# Patient Record
Sex: Male | Born: 1950
Health system: Southern US, Community
[De-identification: ages and names within clinical notes are randomized; demographics above are authoritative.]

## PROBLEM LIST (undated history)

## (undated) DIAGNOSIS — E782 Mixed hyperlipidemia: Secondary | ICD-10-CM

## (undated) DIAGNOSIS — I2510 Atherosclerotic heart disease of native coronary artery without angina pectoris: Secondary | ICD-10-CM

## (undated) DIAGNOSIS — I10 Essential (primary) hypertension: Secondary | ICD-10-CM

## (undated) DIAGNOSIS — E119 Type 2 diabetes mellitus without complications: Secondary | ICD-10-CM

## (undated) DIAGNOSIS — K219 Gastro-esophageal reflux disease without esophagitis: Secondary | ICD-10-CM

## (undated) HISTORY — DX: Atherosclerotic heart disease of native coronary artery without angina pectoris: I25.10

## (undated) HISTORY — DX: Essential (primary) hypertension: I10

## (undated) HISTORY — DX: Mixed hyperlipidemia: E78.2

## (undated) HISTORY — DX: Type 2 diabetes mellitus without complications: E11.9

## (undated) HISTORY — DX: Gastro-esophageal reflux disease without esophagitis: K21.9

---

## 1998-10-19 ENCOUNTER — Encounter: Payer: Self-pay | Admitting: *Deleted

## 1998-10-19 ENCOUNTER — Observation Stay (HOSPITAL_COMMUNITY): Admission: RE | Admit: 1998-10-19 | Discharge: 1998-10-20 | Payer: Self-pay | Admitting: Internal Medicine

## 2001-10-24 ENCOUNTER — Ambulatory Visit (HOSPITAL_BASED_OUTPATIENT_CLINIC_OR_DEPARTMENT_OTHER): Admission: RE | Admit: 2001-10-24 | Discharge: 2001-10-24 | Payer: Self-pay | Admitting: Internal Medicine

## 2003-07-26 ENCOUNTER — Encounter: Payer: Self-pay | Admitting: Otolaryngology

## 2003-07-30 ENCOUNTER — Inpatient Hospital Stay (HOSPITAL_COMMUNITY): Admission: RE | Admit: 2003-07-30 | Discharge: 2003-07-31 | Payer: Self-pay | Admitting: Otolaryngology

## 2003-11-12 ENCOUNTER — Ambulatory Visit (HOSPITAL_BASED_OUTPATIENT_CLINIC_OR_DEPARTMENT_OTHER): Admission: RE | Admit: 2003-11-12 | Discharge: 2003-11-12 | Payer: Self-pay | Admitting: Otolaryngology

## 2005-12-31 DIAGNOSIS — I2510 Atherosclerotic heart disease of native coronary artery without angina pectoris: Secondary | ICD-10-CM

## 2005-12-31 HISTORY — DX: Atherosclerotic heart disease of native coronary artery without angina pectoris: I25.10

## 2006-07-12 ENCOUNTER — Ambulatory Visit: Payer: Self-pay | Admitting: Cardiology

## 2006-07-15 ENCOUNTER — Ambulatory Visit: Payer: Self-pay | Admitting: Cardiology

## 2006-07-15 ENCOUNTER — Observation Stay (HOSPITAL_COMMUNITY): Admission: RE | Admit: 2006-07-15 | Discharge: 2006-07-16 | Payer: Self-pay | Admitting: Cardiology

## 2006-07-15 ENCOUNTER — Inpatient Hospital Stay (HOSPITAL_BASED_OUTPATIENT_CLINIC_OR_DEPARTMENT_OTHER): Admission: RE | Admit: 2006-07-15 | Discharge: 2006-07-15 | Payer: Self-pay | Admitting: Cardiology

## 2006-08-09 ENCOUNTER — Ambulatory Visit: Payer: Self-pay | Admitting: Cardiology

## 2006-09-05 ENCOUNTER — Ambulatory Visit: Payer: Self-pay | Admitting: Cardiology

## 2007-01-22 ENCOUNTER — Ambulatory Visit: Payer: Self-pay | Admitting: Cardiology

## 2020-08-30 DIAGNOSIS — I2510 Atherosclerotic heart disease of native coronary artery without angina pectoris: Secondary | ICD-10-CM | POA: Diagnosis not present

## 2020-08-30 DIAGNOSIS — I10 Essential (primary) hypertension: Secondary | ICD-10-CM | POA: Diagnosis not present

## 2020-08-30 DIAGNOSIS — E1169 Type 2 diabetes mellitus with other specified complication: Secondary | ICD-10-CM | POA: Diagnosis not present

## 2020-08-30 DIAGNOSIS — E785 Hyperlipidemia, unspecified: Secondary | ICD-10-CM | POA: Diagnosis not present

## 2020-11-08 DIAGNOSIS — Z1389 Encounter for screening for other disorder: Secondary | ICD-10-CM | POA: Diagnosis not present

## 2020-11-08 DIAGNOSIS — I2510 Atherosclerotic heart disease of native coronary artery without angina pectoris: Secondary | ICD-10-CM | POA: Diagnosis not present

## 2020-11-08 DIAGNOSIS — E559 Vitamin D deficiency, unspecified: Secondary | ICD-10-CM | POA: Diagnosis not present

## 2020-11-08 DIAGNOSIS — I10 Essential (primary) hypertension: Secondary | ICD-10-CM | POA: Diagnosis not present

## 2020-11-08 DIAGNOSIS — E1169 Type 2 diabetes mellitus with other specified complication: Secondary | ICD-10-CM | POA: Diagnosis not present

## 2020-11-08 DIAGNOSIS — K219 Gastro-esophageal reflux disease without esophagitis: Secondary | ICD-10-CM | POA: Diagnosis not present

## 2020-11-08 DIAGNOSIS — E785 Hyperlipidemia, unspecified: Secondary | ICD-10-CM | POA: Diagnosis not present

## 2020-12-20 DIAGNOSIS — Z23 Encounter for immunization: Secondary | ICD-10-CM | POA: Diagnosis not present

## 2020-12-21 ENCOUNTER — Ambulatory Visit (HOSPITAL_COMMUNITY)
Admission: RE | Admit: 2020-12-21 | Discharge: 2020-12-21 | Disposition: A | Discharge status: Home / Self Care | Payer: Medicare Other | Source: Ambulatory Visit | Attending: Internal Medicine | Admitting: Internal Medicine

## 2020-12-21 ENCOUNTER — Other Ambulatory Visit: Payer: Self-pay | Admitting: Internal Medicine

## 2020-12-21 ENCOUNTER — Other Ambulatory Visit: Payer: Self-pay

## 2020-12-21 ENCOUNTER — Other Ambulatory Visit (HOSPITAL_COMMUNITY): Payer: Self-pay | Admitting: Internal Medicine

## 2020-12-21 DIAGNOSIS — I10 Essential (primary) hypertension: Secondary | ICD-10-CM | POA: Diagnosis not present

## 2020-12-21 DIAGNOSIS — M79604 Pain in right leg: Secondary | ICD-10-CM | POA: Diagnosis not present

## 2020-12-21 DIAGNOSIS — E785 Hyperlipidemia, unspecified: Secondary | ICD-10-CM | POA: Diagnosis not present

## 2020-12-21 DIAGNOSIS — E1169 Type 2 diabetes mellitus with other specified complication: Secondary | ICD-10-CM | POA: Diagnosis not present

## 2020-12-21 DIAGNOSIS — I2510 Atherosclerotic heart disease of native coronary artery without angina pectoris: Secondary | ICD-10-CM | POA: Diagnosis not present

## 2020-12-21 DIAGNOSIS — M25561 Pain in right knee: Secondary | ICD-10-CM | POA: Diagnosis not present

## 2020-12-21 DIAGNOSIS — R600 Localized edema: Secondary | ICD-10-CM | POA: Diagnosis not present

## 2021-01-28 DIAGNOSIS — E1169 Type 2 diabetes mellitus with other specified complication: Secondary | ICD-10-CM | POA: Diagnosis not present

## 2021-01-28 DIAGNOSIS — I10 Essential (primary) hypertension: Secondary | ICD-10-CM | POA: Diagnosis not present

## 2021-01-28 DIAGNOSIS — K219 Gastro-esophageal reflux disease without esophagitis: Secondary | ICD-10-CM | POA: Diagnosis not present

## 2021-01-28 DIAGNOSIS — E785 Hyperlipidemia, unspecified: Secondary | ICD-10-CM | POA: Diagnosis not present

## 2021-02-07 DIAGNOSIS — I2510 Atherosclerotic heart disease of native coronary artery without angina pectoris: Secondary | ICD-10-CM | POA: Diagnosis not present

## 2021-02-07 DIAGNOSIS — K219 Gastro-esophageal reflux disease without esophagitis: Secondary | ICD-10-CM | POA: Diagnosis not present

## 2021-02-07 DIAGNOSIS — E1169 Type 2 diabetes mellitus with other specified complication: Secondary | ICD-10-CM | POA: Diagnosis not present

## 2021-02-07 DIAGNOSIS — I10 Essential (primary) hypertension: Secondary | ICD-10-CM | POA: Diagnosis not present

## 2021-02-07 DIAGNOSIS — E785 Hyperlipidemia, unspecified: Secondary | ICD-10-CM | POA: Diagnosis not present

## 2021-02-09 DIAGNOSIS — L820 Inflamed seborrheic keratosis: Secondary | ICD-10-CM | POA: Diagnosis not present

## 2021-02-09 DIAGNOSIS — Z1283 Encounter for screening for malignant neoplasm of skin: Secondary | ICD-10-CM | POA: Diagnosis not present

## 2021-02-09 DIAGNOSIS — D225 Melanocytic nevi of trunk: Secondary | ICD-10-CM | POA: Diagnosis not present

## 2021-02-27 DIAGNOSIS — E1169 Type 2 diabetes mellitus with other specified complication: Secondary | ICD-10-CM | POA: Diagnosis not present

## 2021-02-27 DIAGNOSIS — K219 Gastro-esophageal reflux disease without esophagitis: Secondary | ICD-10-CM | POA: Diagnosis not present

## 2021-02-27 DIAGNOSIS — I2510 Atherosclerotic heart disease of native coronary artery without angina pectoris: Secondary | ICD-10-CM | POA: Diagnosis not present

## 2021-02-27 DIAGNOSIS — I10 Essential (primary) hypertension: Secondary | ICD-10-CM | POA: Diagnosis not present

## 2021-02-27 DIAGNOSIS — E785 Hyperlipidemia, unspecified: Secondary | ICD-10-CM | POA: Diagnosis not present

## 2021-03-01 DIAGNOSIS — E785 Hyperlipidemia, unspecified: Secondary | ICD-10-CM | POA: Diagnosis not present

## 2021-03-01 DIAGNOSIS — K219 Gastro-esophageal reflux disease without esophagitis: Secondary | ICD-10-CM | POA: Diagnosis not present

## 2021-03-28 DIAGNOSIS — M25569 Pain in unspecified knee: Secondary | ICD-10-CM | POA: Diagnosis not present

## 2021-03-29 DIAGNOSIS — I2510 Atherosclerotic heart disease of native coronary artery without angina pectoris: Secondary | ICD-10-CM | POA: Diagnosis not present

## 2021-03-29 DIAGNOSIS — K219 Gastro-esophageal reflux disease without esophagitis: Secondary | ICD-10-CM | POA: Diagnosis not present

## 2021-03-29 DIAGNOSIS — I10 Essential (primary) hypertension: Secondary | ICD-10-CM | POA: Diagnosis not present

## 2021-03-29 DIAGNOSIS — E785 Hyperlipidemia, unspecified: Secondary | ICD-10-CM | POA: Diagnosis not present

## 2021-03-29 DIAGNOSIS — E1169 Type 2 diabetes mellitus with other specified complication: Secondary | ICD-10-CM | POA: Diagnosis not present

## 2021-04-04 DIAGNOSIS — M25569 Pain in unspecified knee: Secondary | ICD-10-CM | POA: Diagnosis not present

## 2021-04-12 DIAGNOSIS — S83241A Other tear of medial meniscus, current injury, right knee, initial encounter: Secondary | ICD-10-CM | POA: Diagnosis not present

## 2021-04-12 DIAGNOSIS — M1711 Unilateral primary osteoarthritis, right knee: Secondary | ICD-10-CM | POA: Diagnosis not present

## 2021-04-12 DIAGNOSIS — M25561 Pain in right knee: Secondary | ICD-10-CM | POA: Diagnosis not present

## 2021-04-18 DIAGNOSIS — S83241D Other tear of medial meniscus, current injury, right knee, subsequent encounter: Secondary | ICD-10-CM | POA: Diagnosis not present

## 2021-04-19 DIAGNOSIS — K219 Gastro-esophageal reflux disease without esophagitis: Secondary | ICD-10-CM | POA: Diagnosis not present

## 2021-04-19 DIAGNOSIS — S83209A Unspecified tear of unspecified meniscus, current injury, unspecified knee, initial encounter: Secondary | ICD-10-CM | POA: Diagnosis not present

## 2021-04-19 DIAGNOSIS — E785 Hyperlipidemia, unspecified: Secondary | ICD-10-CM | POA: Diagnosis not present

## 2021-04-19 DIAGNOSIS — E1169 Type 2 diabetes mellitus with other specified complication: Secondary | ICD-10-CM | POA: Diagnosis not present

## 2021-04-19 DIAGNOSIS — R739 Hyperglycemia, unspecified: Secondary | ICD-10-CM | POA: Diagnosis not present

## 2021-04-19 DIAGNOSIS — Z7689 Persons encountering health services in other specified circumstances: Secondary | ICD-10-CM | POA: Diagnosis not present

## 2021-04-19 DIAGNOSIS — I10 Essential (primary) hypertension: Secondary | ICD-10-CM | POA: Diagnosis not present

## 2021-04-19 DIAGNOSIS — I2510 Atherosclerotic heart disease of native coronary artery without angina pectoris: Secondary | ICD-10-CM | POA: Diagnosis not present

## 2021-04-29 DIAGNOSIS — K219 Gastro-esophageal reflux disease without esophagitis: Secondary | ICD-10-CM | POA: Diagnosis not present

## 2021-04-29 DIAGNOSIS — E1169 Type 2 diabetes mellitus with other specified complication: Secondary | ICD-10-CM | POA: Diagnosis not present

## 2021-04-29 DIAGNOSIS — I10 Essential (primary) hypertension: Secondary | ICD-10-CM | POA: Diagnosis not present

## 2021-04-29 DIAGNOSIS — E785 Hyperlipidemia, unspecified: Secondary | ICD-10-CM | POA: Diagnosis not present

## 2021-04-29 DIAGNOSIS — I2510 Atherosclerotic heart disease of native coronary artery without angina pectoris: Secondary | ICD-10-CM | POA: Diagnosis not present

## 2021-05-09 DIAGNOSIS — I2510 Atherosclerotic heart disease of native coronary artery without angina pectoris: Secondary | ICD-10-CM | POA: Diagnosis not present

## 2021-05-09 DIAGNOSIS — E1169 Type 2 diabetes mellitus with other specified complication: Secondary | ICD-10-CM | POA: Diagnosis not present

## 2021-05-09 DIAGNOSIS — K219 Gastro-esophageal reflux disease without esophagitis: Secondary | ICD-10-CM | POA: Diagnosis not present

## 2021-05-09 DIAGNOSIS — S83209A Unspecified tear of unspecified meniscus, current injury, unspecified knee, initial encounter: Secondary | ICD-10-CM | POA: Diagnosis not present

## 2021-05-09 DIAGNOSIS — E785 Hyperlipidemia, unspecified: Secondary | ICD-10-CM | POA: Diagnosis not present

## 2021-05-09 DIAGNOSIS — I10 Essential (primary) hypertension: Secondary | ICD-10-CM | POA: Diagnosis not present

## 2021-05-17 ENCOUNTER — Ambulatory Visit: Payer: Medicare Other | Admitting: Cardiology

## 2021-06-01 DIAGNOSIS — H5213 Myopia, bilateral: Secondary | ICD-10-CM | POA: Diagnosis not present

## 2021-06-01 DIAGNOSIS — H43393 Other vitreous opacities, bilateral: Secondary | ICD-10-CM | POA: Diagnosis not present

## 2021-06-06 ENCOUNTER — Encounter: Payer: Self-pay | Admitting: *Deleted

## 2021-06-06 ENCOUNTER — Encounter: Payer: Self-pay | Admitting: Cardiology

## 2021-06-06 NOTE — Progress Notes (Signed)
Cardiology Office Note  Date: 06/07/2021   ID: Ryan Mckee, DOB 1950/09/05, MRN [id number]  PCP:  Helena Nation, MD  Cardiologist:  Rozann Lesches, MD Electrophysiologist:  None   Chief Complaint  Patient presents with  . Preoperative evaluation    History of Present Illness: Ryan Mckee is a 71 y.o. male referred for cardiology consultation by Dr. Jimmye Norman to establish cardiology follow-up and for preoperative evaluation.  He is being considered for arthroscopic right knee surgery with Dr. Noemi Chapel due to torn meniscus.  I could not actually pull his old records, but he did have stent cards today to review.  He underwent placement of BMS to the mid RCA and DES to the mid LAD by Dr. Olevia Perches back in 2007.  Interval cardiology follow-up with Dr. Hamilton Capri with [facility] noted in 2015, but no regular cardiology follow-up since that time.  He deals an antique toys, goes to several shows, does a lot of lifting and packing with this and reports no angina or breathlessness beyond NYHA class II.  Main limitation is right knee pain at this point.  No palpitations or syncope.  He describes activities meeting or exceeding 4 METS.  I reviewed his recent ECG and lab work as noted below.  RCRI cardiac risk calculator indicates class II, 0.9% chance of major adverse cardiac event.   Past Medical History:  Diagnosis Date  . CAD (coronary artery disease) 2007   BMS to mid RCA and DES to mid LAD 2007  . Essential hypertension   . GERD (gastroesophageal reflux disease)   . Mixed hyperlipidemia   . Type 2 diabetes mellitus ([place])     Past Surgical History:  Procedure Laterality Date  . GALLBLADDER SURGERY    . Renal stent intervention    . TONSILLECTOMY      Current Outpatient Medications  Medication Sig Dispense Refill  . Alogliptin Benzoate 25 MG TABS Take 25 mg by mouth daily.    Marland Kitchen amLODipine (NORVASC) 10 MG tablet Take 10 mg by mouth daily.    Marland Kitchen aspirin 325 MG tablet Take 325 mg by  mouth daily.    . Cholecalciferol (VITAMIN D) 50 MCG (2000 UT) tablet Take 2,000 Units by mouth daily.    Marland Kitchen glipiZIDE (GLUCOTROL) 10 MG tablet Take 10 mg by mouth 2 (two) times daily before a meal.    . lisinopril (ZESTRIL) 20 MG tablet Take 20 mg by mouth 2 (two) times daily.    . metoprolol tartrate (LOPRESSOR) 50 MG tablet Take 50 mg by mouth 2 (two) times daily.    Marland Kitchen omeprazole (PRILOSEC) 20 MG capsule Take 40 mg by mouth daily.    . rosuvastatin (CRESTOR) 20 MG tablet Take 20 mg by mouth daily.    Marland Kitchen spironolactone (ALDACTONE) 25 MG tablet Take 25 mg by mouth 2 (two) times daily.     No current facility-administered medications for this visit.   Allergies:  Sulfa antibiotics   Social History: The patient  reports that he quit smoking about 20 years ago. His smoking use included cigarettes. He has never used smokeless tobacco. He reports current alcohol use. He reports that he does not use drugs.   Family History: The patient's family history includes Heart attack (age of onset: 3) in his father; Hypertension in his mother.   ROS: No falls.  No orthopnea or PND.  Physical Exam: VS:  BP (!) 142/84   Pulse 60   Ht 5' 9.5" (1.765 m)  Wt 208 lb 6.4 oz (94.5 kg)   SpO2 96%   BMI 30.33 kg/m , BMI Body mass index is 30.33 kg/m.  Wt Readings from Last 3 Encounters:  06/07/21 208 lb 6.4 oz (94.5 kg)    General: Patient appears comfortable at rest. HEENT: Conjunctiva and lids normal, bearded, wearing a mask. Neck: Supple, no elevated JVP or carotid bruits, no thyromegaly. Lungs: Clear to auscultation, nonlabored breathing at rest. Cardiac: Regular rate and rhythm, no S3 or significant systolic murmur, no pericardial rub. Abdomen: Soft, nontender, bowel sounds present. Extremities: No pitting edema, distal pulses 2+. Skin: Warm and dry. Musculoskeletal: No kyphosis. Neuropsychiatric: Alert and oriented x3, affect grossly appropriate.  ECG:  An ECG dated April 2022 was personally  reviewed today and demonstrated:  Normal sinus rhythm with decreased R wave progression, rule out old anterior infarct pattern.  Recent Labwork:  April 2022: Hemoglobin A1c 6.6%, BUN 18, creatinine 1.07, potassium 5.2, AST 22, ALT 32, hemoglobin 15.8, platelets 269  Other Studies Reviewed Today:  No prior cardiac studies for review today.  Assessment and Plan:  1.  Preoperative cardiac evaluation in a 71 year old male with history of CAD as noted above, hypertension, hyperlipidemia, and type 2 diabetes mellitus.  He is not on insulin, renal function normal, no prior history of stroke.  RCRI cardiac risk calculator indicates class II, 0.9% chance of major adverse cardiac event.  He does not report any angina and require further ischemic testing at this time and should be able to proceed as planned with arthroscopic right knee surgery.  2.  CAD status post BMS to the mid RCA and DES to the mid LAD in 2007.  He reports no active angina.  Continue aspirin, Norvasc, lisinopril, Lopressor, and Crestor.  3.  Essential hypertension, following with Dr. Jimmye Norman at [place].  Currently on Aldactone, Lopressor, lisinopril, and Norvasc.  I reviewed his recent lab work.  No changes were made today.  Medication Adjustments/Labs and Tests Ordered: Current medicines are reviewed at length with the patient today.  Concerns regarding medicines are outlined above.   Tests Ordered: No orders of the defined types were placed in this encounter.   Medication Changes: No orders of the defined types were placed in this encounter.   Disposition:  Follow up 6 months.  Signed, Satira Sark, MD, [facility] 06/07/2021 9:30 AM    [facility] [place], [place], [place] [address] Phone: [phone]; Fax: [phone]

## 2021-06-07 ENCOUNTER — Encounter: Payer: Self-pay | Admitting: Cardiology

## 2021-06-07 ENCOUNTER — Ambulatory Visit: Payer: Medicare Other | Admitting: Cardiology

## 2021-06-07 VITALS — BP 142/84 | HR 60 | Ht 69.5 in | Wt 208.4 lb

## 2021-06-07 DIAGNOSIS — I25119 Atherosclerotic heart disease of native coronary artery with unspecified angina pectoris: Secondary | ICD-10-CM | POA: Diagnosis not present

## 2021-06-07 DIAGNOSIS — Z01810 Encounter for preprocedural cardiovascular examination: Secondary | ICD-10-CM

## 2021-06-07 DIAGNOSIS — I10 Essential (primary) hypertension: Secondary | ICD-10-CM | POA: Diagnosis not present

## 2021-06-07 NOTE — Patient Instructions (Addendum)

## 2021-06-08 ENCOUNTER — Encounter: Payer: Self-pay | Admitting: *Deleted

## 2021-06-08 NOTE — Telephone Encounter (Signed)
This encounter was created in error - please disregard.

## 2021-06-21 DIAGNOSIS — M94261 Chondromalacia, right knee: Secondary | ICD-10-CM | POA: Diagnosis not present

## 2021-06-21 DIAGNOSIS — S83281A Other tear of lateral meniscus, current injury, right knee, initial encounter: Secondary | ICD-10-CM | POA: Diagnosis not present

## 2021-06-21 DIAGNOSIS — S83241A Other tear of medial meniscus, current injury, right knee, initial encounter: Secondary | ICD-10-CM | POA: Diagnosis not present

## 2021-06-21 DIAGNOSIS — S83231A Complex tear of medial meniscus, current injury, right knee, initial encounter: Secondary | ICD-10-CM | POA: Diagnosis not present

## 2021-06-21 DIAGNOSIS — G8918 Other acute postprocedural pain: Secondary | ICD-10-CM | POA: Diagnosis not present

## 2021-06-21 DIAGNOSIS — M2341 Loose body in knee, right knee: Secondary | ICD-10-CM | POA: Diagnosis not present

## 2021-07-10 DIAGNOSIS — E1169 Type 2 diabetes mellitus with other specified complication: Secondary | ICD-10-CM | POA: Diagnosis not present

## 2021-07-10 DIAGNOSIS — Z0001 Encounter for general adult medical examination with abnormal findings: Secondary | ICD-10-CM | POA: Diagnosis not present

## 2021-07-20 DIAGNOSIS — M2341 Loose body in knee, right knee: Secondary | ICD-10-CM | POA: Diagnosis not present

## 2021-07-30 DIAGNOSIS — K219 Gastro-esophageal reflux disease without esophagitis: Secondary | ICD-10-CM | POA: Diagnosis not present

## 2021-07-30 DIAGNOSIS — E1169 Type 2 diabetes mellitus with other specified complication: Secondary | ICD-10-CM | POA: Diagnosis not present

## 2021-07-30 DIAGNOSIS — I2510 Atherosclerotic heart disease of native coronary artery without angina pectoris: Secondary | ICD-10-CM | POA: Diagnosis not present

## 2021-07-30 DIAGNOSIS — E785 Hyperlipidemia, unspecified: Secondary | ICD-10-CM | POA: Diagnosis not present

## 2021-07-30 DIAGNOSIS — I10 Essential (primary) hypertension: Secondary | ICD-10-CM | POA: Diagnosis not present

## 2021-08-15 DIAGNOSIS — H40053 Ocular hypertension, bilateral: Secondary | ICD-10-CM | POA: Diagnosis not present

## 2021-08-17 DIAGNOSIS — K219 Gastro-esophageal reflux disease without esophagitis: Secondary | ICD-10-CM | POA: Diagnosis not present

## 2021-08-17 DIAGNOSIS — E785 Hyperlipidemia, unspecified: Secondary | ICD-10-CM | POA: Diagnosis not present

## 2021-08-17 DIAGNOSIS — S83209A Unspecified tear of unspecified meniscus, current injury, unspecified knee, initial encounter: Secondary | ICD-10-CM | POA: Diagnosis not present

## 2021-08-17 DIAGNOSIS — I10 Essential (primary) hypertension: Secondary | ICD-10-CM | POA: Diagnosis not present

## 2021-08-17 DIAGNOSIS — E1169 Type 2 diabetes mellitus with other specified complication: Secondary | ICD-10-CM | POA: Diagnosis not present

## 2021-08-17 DIAGNOSIS — I2510 Atherosclerotic heart disease of native coronary artery without angina pectoris: Secondary | ICD-10-CM | POA: Diagnosis not present

## 2021-08-30 DIAGNOSIS — I2510 Atherosclerotic heart disease of native coronary artery without angina pectoris: Secondary | ICD-10-CM | POA: Diagnosis not present

## 2021-08-30 DIAGNOSIS — I10 Essential (primary) hypertension: Secondary | ICD-10-CM | POA: Diagnosis not present

## 2021-08-30 DIAGNOSIS — E785 Hyperlipidemia, unspecified: Secondary | ICD-10-CM | POA: Diagnosis not present

## 2021-08-30 DIAGNOSIS — K219 Gastro-esophageal reflux disease without esophagitis: Secondary | ICD-10-CM | POA: Diagnosis not present

## 2021-08-30 DIAGNOSIS — E1169 Type 2 diabetes mellitus with other specified complication: Secondary | ICD-10-CM | POA: Diagnosis not present

## 2021-09-25 DIAGNOSIS — H5310 Unspecified subjective visual disturbances: Secondary | ICD-10-CM | POA: Diagnosis not present

## 2021-09-25 DIAGNOSIS — H43812 Vitreous degeneration, left eye: Secondary | ICD-10-CM | POA: Diagnosis not present

## 2021-09-25 DIAGNOSIS — H40051 Ocular hypertension, right eye: Secondary | ICD-10-CM | POA: Diagnosis not present

## 2021-09-25 DIAGNOSIS — H2513 Age-related nuclear cataract, bilateral: Secondary | ICD-10-CM | POA: Diagnosis not present

## 2021-09-25 DIAGNOSIS — H43393 Other vitreous opacities, bilateral: Secondary | ICD-10-CM | POA: Diagnosis not present

## 2021-09-28 DIAGNOSIS — M1611 Unilateral primary osteoarthritis, right hip: Secondary | ICD-10-CM | POA: Diagnosis not present

## 2021-09-29 DIAGNOSIS — E785 Hyperlipidemia, unspecified: Secondary | ICD-10-CM | POA: Diagnosis not present

## 2021-09-29 DIAGNOSIS — E1169 Type 2 diabetes mellitus with other specified complication: Secondary | ICD-10-CM | POA: Diagnosis not present

## 2021-09-29 DIAGNOSIS — I10 Essential (primary) hypertension: Secondary | ICD-10-CM | POA: Diagnosis not present

## 2021-09-29 DIAGNOSIS — K219 Gastro-esophageal reflux disease without esophagitis: Secondary | ICD-10-CM | POA: Diagnosis not present

## 2021-09-29 DIAGNOSIS — I2510 Atherosclerotic heart disease of native coronary artery without angina pectoris: Secondary | ICD-10-CM | POA: Diagnosis not present

## 2021-10-07 IMAGING — US US EXTREM LOW VENOUS*R*
1 series · 14 of 24 positions shown · non-contrast
Comparison: None.

CLINICAL DATA: Acute right lower extremity pain for 1 month, edema

EXAM:
RIGHT LOWER EXTREMITY VENOUS DOPPLER ULTRASOUND
TECHNIQUE: Gray-scale sonography with compression, as well as color and duplex
ultrasound, were performed to evaluate the deep venous system(s)
from the level of the common femoral vein through the popliteal and
proximal calf veins.

[Series 1: us venous img lower uni right (dvt) · portal-venous · 14 of 36 slices shown]
[im 1/36]
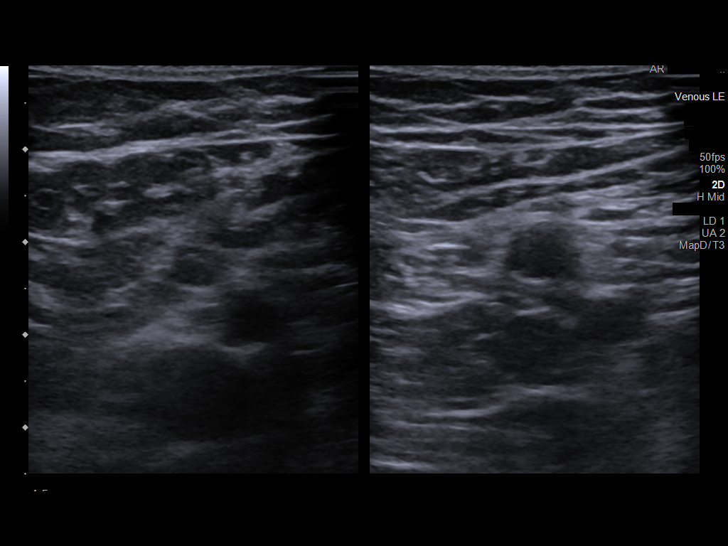
[im 4/36]
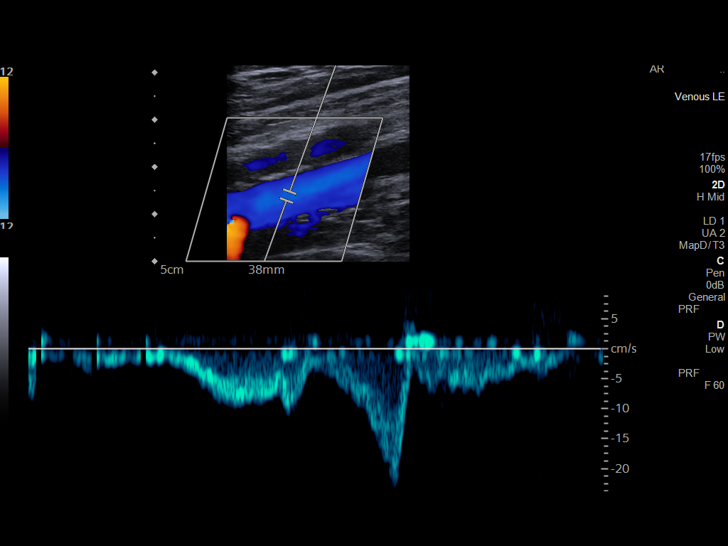
[im 7/36]
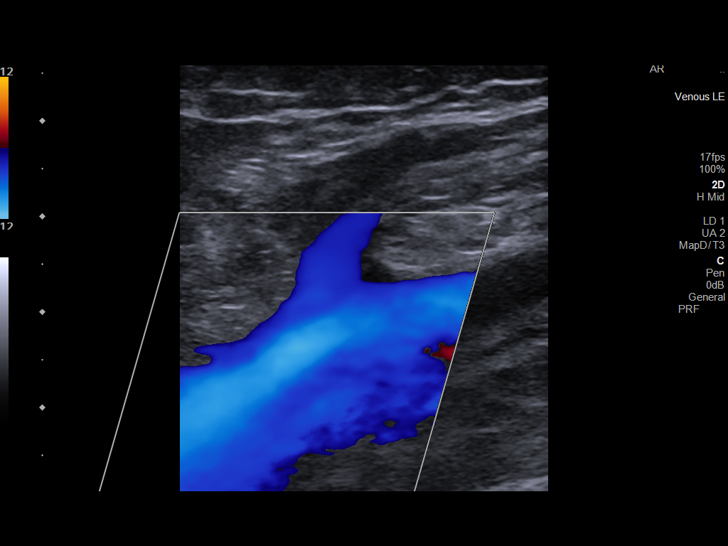
[im 10/36]
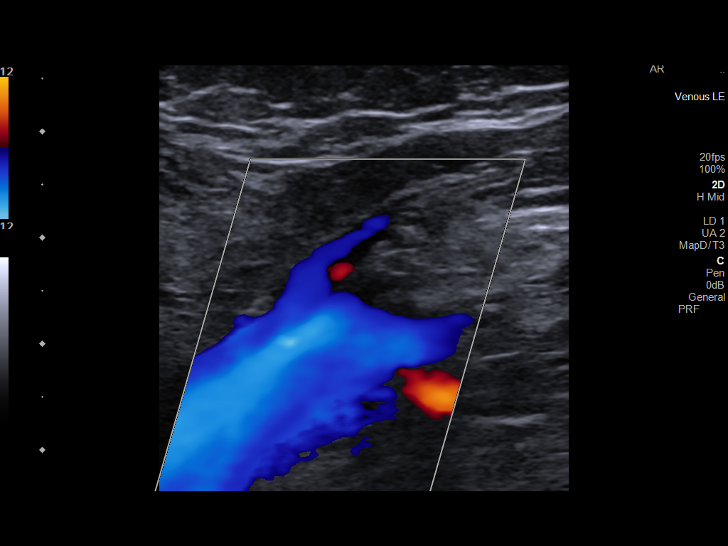
[im 11/36]
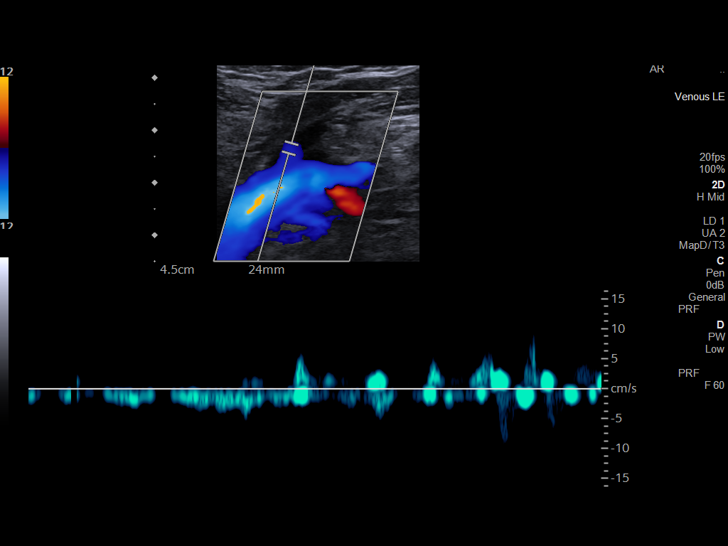
[im 14/36]
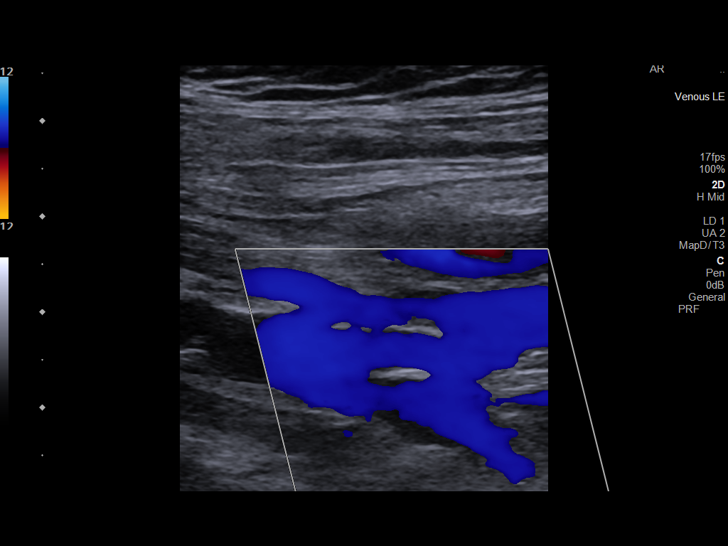
[im 17/36]
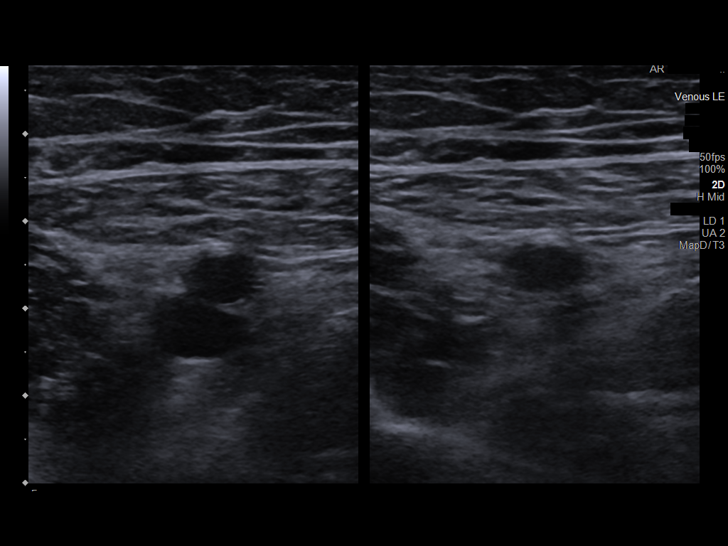
[im 19/36]
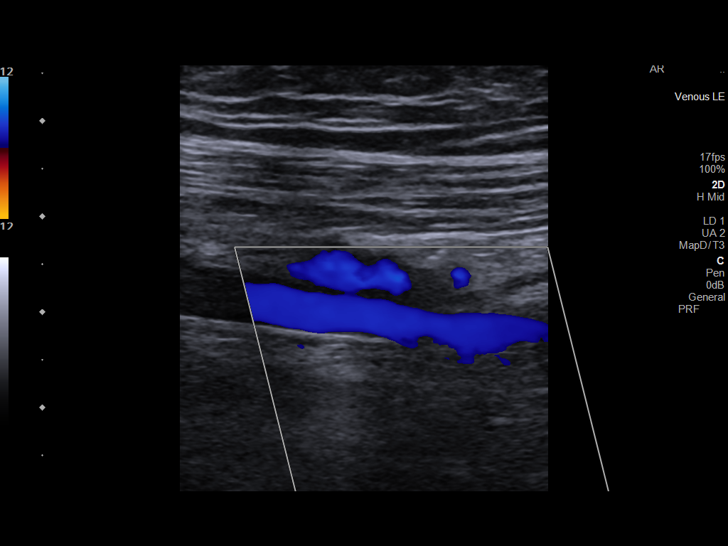
[im 22/36]
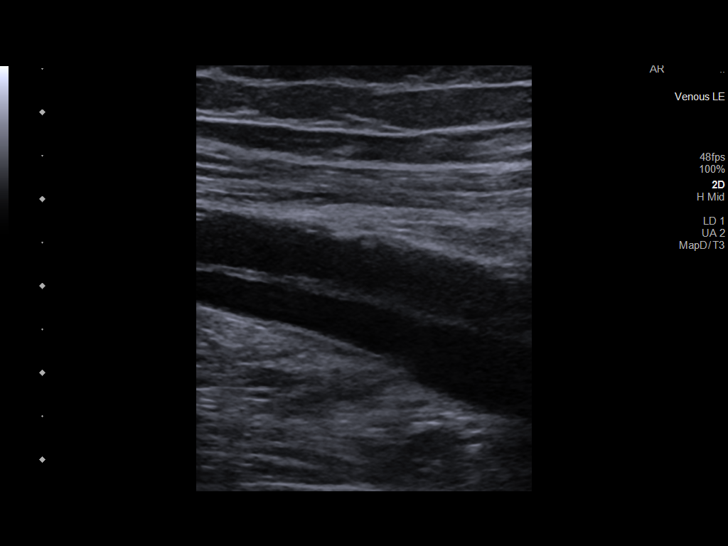
[im 25/36]
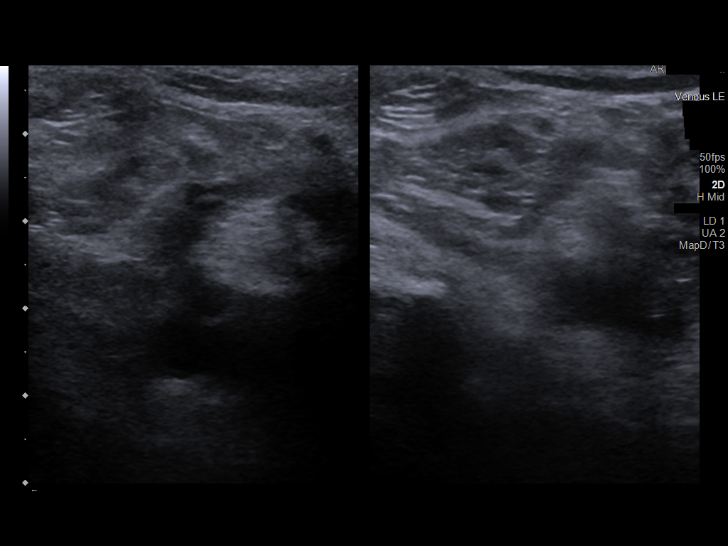
[im 28/36]
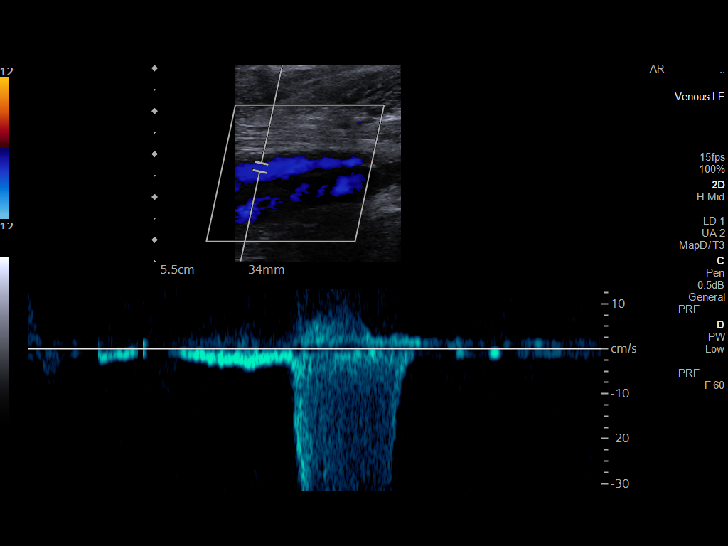
[im 29/36]
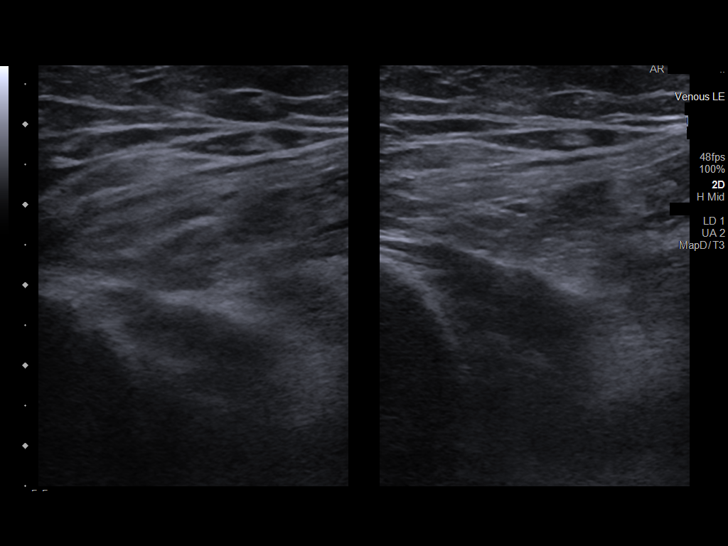
[im 32/36]
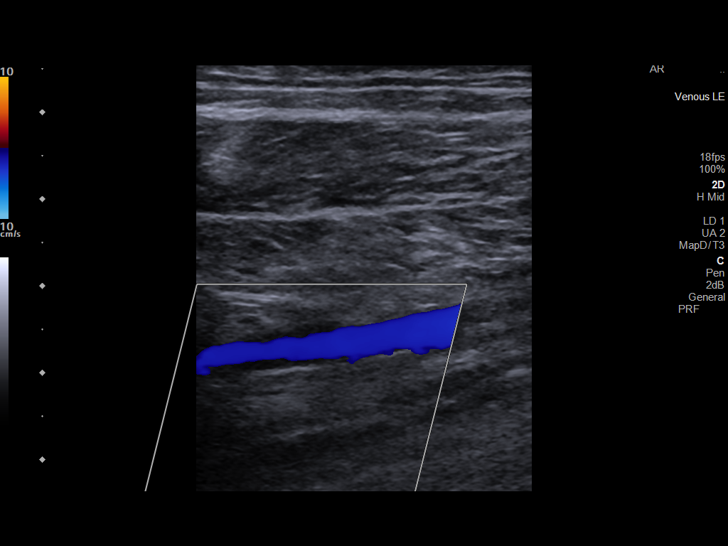
[im 36/36]
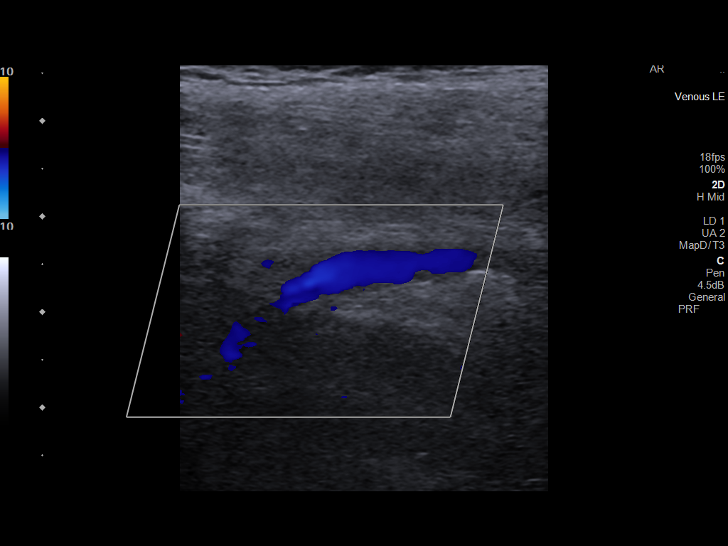

[14 of 24 positions shown; findings below may reference images not displayed]

FINDINGS: VENOUS

Normal compressibility of the common femoral, superficial femoral,
and popliteal veins, as well as the visualized calf veins.
Visualized portions of profunda femoral vein and great saphenous
vein unremarkable. No filling defects to suggest DVT on grayscale or
color Doppler imaging. Doppler waveforms show normal direction of
venous flow, normal respiratory plasticity and response to
augmentation.

Limited views of the contralateral common femoral vein are
unremarkable.

OTHER

None.

Limitations: none
IMPRESSION: Negative.

## 2021-11-08 DIAGNOSIS — M1611 Unilateral primary osteoarthritis, right hip: Secondary | ICD-10-CM | POA: Diagnosis not present

## 2021-11-13 DIAGNOSIS — I2510 Atherosclerotic heart disease of native coronary artery without angina pectoris: Secondary | ICD-10-CM | POA: Diagnosis not present

## 2021-11-13 DIAGNOSIS — Z23 Encounter for immunization: Secondary | ICD-10-CM | POA: Diagnosis not present

## 2021-11-13 DIAGNOSIS — I10 Essential (primary) hypertension: Secondary | ICD-10-CM | POA: Diagnosis not present

## 2021-11-13 DIAGNOSIS — E785 Hyperlipidemia, unspecified: Secondary | ICD-10-CM | POA: Diagnosis not present

## 2021-11-13 DIAGNOSIS — S83209A Unspecified tear of unspecified meniscus, current injury, unspecified knee, initial encounter: Secondary | ICD-10-CM | POA: Diagnosis not present

## 2021-11-13 DIAGNOSIS — K219 Gastro-esophageal reflux disease without esophagitis: Secondary | ICD-10-CM | POA: Diagnosis not present

## 2021-11-13 DIAGNOSIS — E1169 Type 2 diabetes mellitus with other specified complication: Secondary | ICD-10-CM | POA: Diagnosis not present

## 2021-12-07 ENCOUNTER — Ambulatory Visit: Payer: Medicare Other | Admitting: Cardiology

## 2022-02-13 DIAGNOSIS — I10 Essential (primary) hypertension: Secondary | ICD-10-CM | POA: Diagnosis not present

## 2022-02-13 DIAGNOSIS — H9190 Unspecified hearing loss, unspecified ear: Secondary | ICD-10-CM | POA: Diagnosis not present

## 2022-02-13 DIAGNOSIS — E785 Hyperlipidemia, unspecified: Secondary | ICD-10-CM | POA: Diagnosis not present

## 2022-02-13 DIAGNOSIS — H6120 Impacted cerumen, unspecified ear: Secondary | ICD-10-CM | POA: Diagnosis not present

## 2022-02-13 DIAGNOSIS — H6123 Impacted cerumen, bilateral: Secondary | ICD-10-CM | POA: Diagnosis not present

## 2022-02-13 DIAGNOSIS — S83209A Unspecified tear of unspecified meniscus, current injury, unspecified knee, initial encounter: Secondary | ICD-10-CM | POA: Diagnosis not present

## 2022-02-13 DIAGNOSIS — E1169 Type 2 diabetes mellitus with other specified complication: Secondary | ICD-10-CM | POA: Diagnosis not present

## 2022-02-13 DIAGNOSIS — K219 Gastro-esophageal reflux disease without esophagitis: Secondary | ICD-10-CM | POA: Diagnosis not present

## 2022-02-13 DIAGNOSIS — E669 Obesity, unspecified: Secondary | ICD-10-CM | POA: Diagnosis not present

## 2022-02-13 DIAGNOSIS — I2510 Atherosclerotic heart disease of native coronary artery without angina pectoris: Secondary | ICD-10-CM | POA: Diagnosis not present

## 2022-02-13 DIAGNOSIS — Z6831 Body mass index (BMI) 31.0-31.9, adult: Secondary | ICD-10-CM | POA: Diagnosis not present

## 2022-03-01 DIAGNOSIS — I2510 Atherosclerotic heart disease of native coronary artery without angina pectoris: Secondary | ICD-10-CM | POA: Diagnosis not present

## 2022-03-01 DIAGNOSIS — E1169 Type 2 diabetes mellitus with other specified complication: Secondary | ICD-10-CM | POA: Diagnosis not present

## 2022-03-01 DIAGNOSIS — E785 Hyperlipidemia, unspecified: Secondary | ICD-10-CM | POA: Diagnosis not present

## 2022-03-01 DIAGNOSIS — S83209A Unspecified tear of unspecified meniscus, current injury, unspecified knee, initial encounter: Secondary | ICD-10-CM | POA: Diagnosis not present

## 2022-03-01 DIAGNOSIS — H9190 Unspecified hearing loss, unspecified ear: Secondary | ICD-10-CM | POA: Diagnosis not present

## 2022-03-01 DIAGNOSIS — K219 Gastro-esophageal reflux disease without esophagitis: Secondary | ICD-10-CM | POA: Diagnosis not present

## 2022-03-01 DIAGNOSIS — I10 Essential (primary) hypertension: Secondary | ICD-10-CM | POA: Diagnosis not present

## 2022-03-01 DIAGNOSIS — Z6832 Body mass index (BMI) 32.0-32.9, adult: Secondary | ICD-10-CM | POA: Diagnosis not present

## 2022-03-19 DIAGNOSIS — M1611 Unilateral primary osteoarthritis, right hip: Secondary | ICD-10-CM | POA: Diagnosis not present

## 2022-04-30 DIAGNOSIS — H8112 Benign paroxysmal vertigo, left ear: Secondary | ICD-10-CM | POA: Diagnosis not present

## 2022-04-30 DIAGNOSIS — Z87891 Personal history of nicotine dependence: Secondary | ICD-10-CM | POA: Diagnosis not present

## 2022-04-30 DIAGNOSIS — Z9889 Other specified postprocedural states: Secondary | ICD-10-CM | POA: Diagnosis not present

## 2022-04-30 DIAGNOSIS — H6123 Impacted cerumen, bilateral: Secondary | ICD-10-CM | POA: Diagnosis not present

## 2022-04-30 DIAGNOSIS — Z9089 Acquired absence of other organs: Secondary | ICD-10-CM | POA: Diagnosis not present

## 2022-05-01 DIAGNOSIS — H8113 Benign paroxysmal vertigo, bilateral: Secondary | ICD-10-CM | POA: Diagnosis not present

## 2022-05-01 DIAGNOSIS — H903 Sensorineural hearing loss, bilateral: Secondary | ICD-10-CM | POA: Diagnosis not present

## 2022-07-09 DIAGNOSIS — E1169 Type 2 diabetes mellitus with other specified complication: Secondary | ICD-10-CM | POA: Diagnosis not present

## 2022-07-09 DIAGNOSIS — K219 Gastro-esophageal reflux disease without esophagitis: Secondary | ICD-10-CM | POA: Diagnosis not present

## 2022-07-09 DIAGNOSIS — E785 Hyperlipidemia, unspecified: Secondary | ICD-10-CM | POA: Diagnosis not present

## 2022-07-13 DIAGNOSIS — K219 Gastro-esophageal reflux disease without esophagitis: Secondary | ICD-10-CM | POA: Diagnosis not present

## 2022-07-13 DIAGNOSIS — Z6832 Body mass index (BMI) 32.0-32.9, adult: Secondary | ICD-10-CM | POA: Diagnosis not present

## 2022-07-13 DIAGNOSIS — H9190 Unspecified hearing loss, unspecified ear: Secondary | ICD-10-CM | POA: Diagnosis not present

## 2022-07-13 DIAGNOSIS — E785 Hyperlipidemia, unspecified: Secondary | ICD-10-CM | POA: Diagnosis not present

## 2022-07-13 DIAGNOSIS — E1169 Type 2 diabetes mellitus with other specified complication: Secondary | ICD-10-CM | POA: Diagnosis not present

## 2022-07-13 DIAGNOSIS — E669 Obesity, unspecified: Secondary | ICD-10-CM | POA: Diagnosis not present

## 2022-07-13 DIAGNOSIS — L0390 Cellulitis, unspecified: Secondary | ICD-10-CM | POA: Diagnosis not present

## 2022-07-13 DIAGNOSIS — I10 Essential (primary) hypertension: Secondary | ICD-10-CM | POA: Diagnosis not present

## 2022-07-13 DIAGNOSIS — I2510 Atherosclerotic heart disease of native coronary artery without angina pectoris: Secondary | ICD-10-CM | POA: Diagnosis not present

## 2022-07-13 DIAGNOSIS — S83209A Unspecified tear of unspecified meniscus, current injury, unspecified knee, initial encounter: Secondary | ICD-10-CM | POA: Diagnosis not present

## 2022-07-13 DIAGNOSIS — Z0001 Encounter for general adult medical examination with abnormal findings: Secondary | ICD-10-CM | POA: Diagnosis not present

## 2022-07-30 DIAGNOSIS — I10 Essential (primary) hypertension: Secondary | ICD-10-CM | POA: Diagnosis not present

## 2022-07-30 DIAGNOSIS — E1122 Type 2 diabetes mellitus with diabetic chronic kidney disease: Secondary | ICD-10-CM | POA: Diagnosis not present

## 2022-08-21 DIAGNOSIS — S83209A Unspecified tear of unspecified meniscus, current injury, unspecified knee, initial encounter: Secondary | ICD-10-CM | POA: Diagnosis not present

## 2022-08-21 DIAGNOSIS — I10 Essential (primary) hypertension: Secondary | ICD-10-CM | POA: Diagnosis not present

## 2022-08-21 DIAGNOSIS — E785 Hyperlipidemia, unspecified: Secondary | ICD-10-CM | POA: Diagnosis not present

## 2022-08-21 DIAGNOSIS — E1169 Type 2 diabetes mellitus with other specified complication: Secondary | ICD-10-CM | POA: Diagnosis not present

## 2022-08-21 DIAGNOSIS — I2510 Atherosclerotic heart disease of native coronary artery without angina pectoris: Secondary | ICD-10-CM | POA: Diagnosis not present

## 2022-08-21 DIAGNOSIS — H9190 Unspecified hearing loss, unspecified ear: Secondary | ICD-10-CM | POA: Diagnosis not present

## 2022-08-21 DIAGNOSIS — K219 Gastro-esophageal reflux disease without esophagitis: Secondary | ICD-10-CM | POA: Diagnosis not present

## 2022-08-21 DIAGNOSIS — E1122 Type 2 diabetes mellitus with diabetic chronic kidney disease: Secondary | ICD-10-CM | POA: Diagnosis not present

## 2022-08-21 DIAGNOSIS — E669 Obesity, unspecified: Secondary | ICD-10-CM | POA: Diagnosis not present

## 2022-11-27 DIAGNOSIS — E7849 Other hyperlipidemia: Secondary | ICD-10-CM | POA: Diagnosis not present

## 2022-11-27 DIAGNOSIS — K219 Gastro-esophageal reflux disease without esophagitis: Secondary | ICD-10-CM | POA: Diagnosis not present

## 2022-11-27 DIAGNOSIS — I10 Essential (primary) hypertension: Secondary | ICD-10-CM | POA: Diagnosis not present

## 2022-11-27 DIAGNOSIS — E1122 Type 2 diabetes mellitus with diabetic chronic kidney disease: Secondary | ICD-10-CM | POA: Diagnosis not present

## 2022-11-29 DIAGNOSIS — K219 Gastro-esophageal reflux disease without esophagitis: Secondary | ICD-10-CM | POA: Diagnosis not present

## 2022-11-29 DIAGNOSIS — E782 Mixed hyperlipidemia: Secondary | ICD-10-CM | POA: Diagnosis not present

## 2022-11-29 DIAGNOSIS — I10 Essential (primary) hypertension: Secondary | ICD-10-CM | POA: Diagnosis not present

## 2022-11-29 DIAGNOSIS — E1169 Type 2 diabetes mellitus with other specified complication: Secondary | ICD-10-CM | POA: Diagnosis not present

## 2022-12-03 DIAGNOSIS — I10 Essential (primary) hypertension: Secondary | ICD-10-CM | POA: Diagnosis not present

## 2022-12-03 DIAGNOSIS — K219 Gastro-esophageal reflux disease without esophagitis: Secondary | ICD-10-CM | POA: Diagnosis not present

## 2022-12-03 DIAGNOSIS — I2510 Atherosclerotic heart disease of native coronary artery without angina pectoris: Secondary | ICD-10-CM | POA: Diagnosis not present

## 2022-12-03 DIAGNOSIS — M25569 Pain in unspecified knee: Secondary | ICD-10-CM | POA: Diagnosis not present

## 2022-12-03 DIAGNOSIS — E7849 Other hyperlipidemia: Secondary | ICD-10-CM | POA: Diagnosis not present

## 2022-12-03 DIAGNOSIS — S83209A Unspecified tear of unspecified meniscus, current injury, unspecified knee, initial encounter: Secondary | ICD-10-CM | POA: Diagnosis not present

## 2022-12-03 DIAGNOSIS — E1122 Type 2 diabetes mellitus with diabetic chronic kidney disease: Secondary | ICD-10-CM | POA: Diagnosis not present

## 2022-12-03 DIAGNOSIS — E785 Hyperlipidemia, unspecified: Secondary | ICD-10-CM | POA: Diagnosis not present

## 2022-12-03 DIAGNOSIS — E1169 Type 2 diabetes mellitus with other specified complication: Secondary | ICD-10-CM | POA: Diagnosis not present

## 2022-12-06 DIAGNOSIS — M25561 Pain in right knee: Secondary | ICD-10-CM | POA: Diagnosis not present

## 2023-03-19 ENCOUNTER — Encounter: Payer: Self-pay | Admitting: Nurse Practitioner

## 2023-03-19 ENCOUNTER — Ambulatory Visit: Payer: Medicare HMO | Attending: Nurse Practitioner | Admitting: Nurse Practitioner

## 2023-03-19 VITALS — BP 108/64 | HR 66 | Ht 69.0 in | Wt 217.6 lb

## 2023-03-19 DIAGNOSIS — I10 Essential (primary) hypertension: Secondary | ICD-10-CM | POA: Diagnosis not present

## 2023-03-19 DIAGNOSIS — Z01810 Encounter for preprocedural cardiovascular examination: Secondary | ICD-10-CM | POA: Diagnosis not present

## 2023-03-19 DIAGNOSIS — E782 Mixed hyperlipidemia: Secondary | ICD-10-CM | POA: Diagnosis not present

## 2023-03-19 DIAGNOSIS — I2510 Atherosclerotic heart disease of native coronary artery without angina pectoris: Secondary | ICD-10-CM | POA: Diagnosis not present

## 2023-03-19 NOTE — Progress Notes (Addendum)
Office Visit    Patient Name: Ryan Mckee Date of Encounter: 03/19/2023  PCP:  [place] Nation, [place]  Cardiologist:  Rozann Lesches, MD  Advanced Practice Provider:  Finis Bud, NP Electrophysiologist:  None  [id number]}  Chief Complaint    Ryan Mckee is a 73 y.o. male with a hx of CAD, s/p BMS to [facility] and DES to [place] in 2007, HTN, mixed HLD, T2DM, and GERD presents today for pre-operative cardiovascular risk assessment.  Past Medical History    Past Medical History:  Diagnosis Date   CAD (coronary artery disease) 2007   BMS to mid RCA and DES to mid LAD 2007   Essential hypertension    GERD (gastroesophageal reflux disease)    Mixed hyperlipidemia    Type 2 diabetes mellitus (H[facility])    Past Surgical History:  Procedure Laterality Date   GALLBLADDER SURGERY     Renal stent intervention     TONSILLECTOMY      Allergies  Allergies  Allergen Reactions   Sulfa Antibiotics Hives and Rash    History of Present Illness    Ryan Mckee is a very delightful 73 y.o. male with a PMH as mentioned above.   Last seen by Dr. Domenic Polite on June 07, 2021. Was very active at the time and doing well from a cardiac perspective. Was pending right knee surgery d/t torn meniscus.   Today he presents for pre-op evaluation. He is pending total knee arthroplasty by Dr. Pennie Rushing of [facility]. Doing well from a cardiac perspective. Denies any chest pain, shortness of breath, palpitations, syncope, presyncope, dizziness, orthopnea, PND, swelling or significant weight changes, acute bleeding, or claudication.  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 Enjoys vacationing in [place], [place]. Next month, says he is planning on attending a viewing of [employer] [place] [employer] with his wife.    EKGs/Labs/Other Studies Reviewed:   The following studies were reviewed today:   EKG:  EKG is ordered today.  The ekg ordered  today demonstrates SR, 68 bpm, no acute ischemic changes.   Recent Labs: No results found for requested labs within last 365 days.  Recent Lipid Panel No results found for: "CHOL", "TRIG", "HDL", "CHOLHDL", "VLDL", "LDLCALC", "LDLDIRECT"  Home Medications   Current Meds  Medication Sig   acetaminophen (TYLENOL) 650 MG CR tablet Take 650 mg by mouth every 8 (eight) hours as needed for pain.   Alogliptin Benzoate 25 MG TABS Take 25 mg by mouth daily.   amLODipine (NORVASC) 10 MG tablet Take 10 mg by mouth daily.   aspirin 325 MG tablet Take 325 mg by mouth daily.   Cholecalciferol (VITAMIN D) 50 MCG (2000 UT) tablet Take 2,000 Units by mouth daily.   glipiZIDE (GLUCOTROL) 10 MG tablet Take 10 mg by mouth 2 (two) times daily before a meal.   ibuprofen (ADVIL) 200 MG tablet Take 200 mg by mouth every 6 (six) hours as needed.   lisinopril (ZESTRIL) 20 MG tablet Take 20 mg by mouth 2 (two) times daily.   metoprolol tartrate (LOPRESSOR) 50 MG tablet Take 50 mg by mouth 2 (two) times daily.   omeprazole (PRILOSEC) 20 MG capsule Take 40 mg by mouth daily.   rosuvastatin (CRESTOR) 20 MG tablet Take 10 mg by mouth daily.   spironolactone (ALDACTONE) 25 MG tablet Take 25 mg by mouth 2 (two) times daily.  Review of Systems    All other systems reviewed and are otherwise negative except as noted above.  Physical Exam    VS:  BP 108/64 (BP Location: Left Arm, Patient Position: Sitting, Cuff Size: Large)   Pulse 66   Ht 5\' 9"  (1.753 m)   Wt 217 lb 9.6 oz (98.7 kg)   SpO2 95%   BMI 32.13 kg/m  , BMI Body mass index is 32.13 kg/m.  Wt Readings from Last 3 Encounters:  03/19/23 217 lb 9.6 oz (98.7 kg)  06/07/21 208 lb 6.4 oz (94.5 kg)     GEN: Well nourished, well developed, in no acute distress. HEENT: normal. Neck: Supple, no JVD, carotid bruits, or masses. Cardiac: S1/S2, RRR, no murmurs, rubs, or gallops. No clubbing, cyanosis, edema.  Radials/PT 2+ and equal bilaterally.   Respiratory:  Respirations regular and unlabored, clear to auscultation bilaterally. MS: No deformity or atrophy. Skin: Warm and dry, no rash. Neuro:  Strength and sensation are intact. Psych: Normal affect.  Assessment & Plan    Pre-operative Cardiovascular Risk Assessment Ryan Mckee perioperative risk of a major cardiac event is 0.4% according to the Revised Cardiac Risk Index (RCRI).  Therefore, he is at low risk for perioperative complications.   His functional capacity is excellent at 6.61 METs according to the Duke Activity Status Index (DASI). Recommendations: According to ACC/AHA guidelines, no further cardiovascular testing needed.  The patient may proceed to surgery at acceptable risk.   Antiplatelet and/or Anticoagulation Recommendations: Okay to hold Aspirin 7 days prior to surgery and resume when felt safe to do so. Will route note to requesting party.    2. CAD, s/p BMS to [facility] and DES to mLAD in 2007 Stable with no anginal symptoms. No indication for ischemic evaluation. Continue current medication regimen. Heart healthy diet and regular cardiovascular exercise encouraged.   3. HTN BP stable. No medication changes at this time. Discussed to monitor BP at home at least 2 hours after medications and sitting for 5-10 minutes. Heart healthy diet and regular cardiovascular exercise encouraged.   4. Mixed HLD Managed by PCP. Continue Crestor. Heart healthy diet and regular cardiovascular exercise encouraged.   Disposition: Follow up in 1 year(s) with Rozann Lesches, MD or APP.  Signed, Finis Bud, NP 03/19/2023, 3:36 PM [place]

## 2023-03-19 NOTE — Patient Instructions (Signed)

## 2023-04-18 DIAGNOSIS — G4733 Obstructive sleep apnea (adult) (pediatric): Secondary | ICD-10-CM | POA: Diagnosis not present

## 2023-04-18 DIAGNOSIS — I10 Essential (primary) hypertension: Secondary | ICD-10-CM | POA: Diagnosis not present

## 2023-04-18 DIAGNOSIS — I2510 Atherosclerotic heart disease of native coronary artery without angina pectoris: Secondary | ICD-10-CM | POA: Diagnosis not present

## 2023-04-18 DIAGNOSIS — M1711 Unilateral primary osteoarthritis, right knee: Secondary | ICD-10-CM | POA: Diagnosis not present

## 2023-05-15 DIAGNOSIS — K219 Gastro-esophageal reflux disease without esophagitis: Secondary | ICD-10-CM | POA: Diagnosis not present

## 2023-05-15 DIAGNOSIS — M1711 Unilateral primary osteoarthritis, right knee: Secondary | ICD-10-CM | POA: Diagnosis not present

## 2023-05-15 DIAGNOSIS — E785 Hyperlipidemia, unspecified: Secondary | ICD-10-CM | POA: Diagnosis not present

## 2023-05-15 DIAGNOSIS — I10 Essential (primary) hypertension: Secondary | ICD-10-CM | POA: Diagnosis not present

## 2023-05-15 DIAGNOSIS — Z7984 Long term (current) use of oral hypoglycemic drugs: Secondary | ICD-10-CM | POA: Diagnosis not present

## 2023-05-15 DIAGNOSIS — M25761 Osteophyte, right knee: Secondary | ICD-10-CM | POA: Diagnosis not present

## 2023-05-15 DIAGNOSIS — E119 Type 2 diabetes mellitus without complications: Secondary | ICD-10-CM | POA: Diagnosis not present

## 2023-05-15 DIAGNOSIS — I2510 Atherosclerotic heart disease of native coronary artery without angina pectoris: Secondary | ICD-10-CM | POA: Diagnosis not present

## 2023-05-15 DIAGNOSIS — G4733 Obstructive sleep apnea (adult) (pediatric): Secondary | ICD-10-CM | POA: Diagnosis not present

## 2023-05-16 DIAGNOSIS — I10 Essential (primary) hypertension: Secondary | ICD-10-CM | POA: Diagnosis not present

## 2023-05-16 DIAGNOSIS — Z7984 Long term (current) use of oral hypoglycemic drugs: Secondary | ICD-10-CM | POA: Diagnosis not present

## 2023-05-16 DIAGNOSIS — G4733 Obstructive sleep apnea (adult) (pediatric): Secondary | ICD-10-CM | POA: Diagnosis not present

## 2023-05-16 DIAGNOSIS — E119 Type 2 diabetes mellitus without complications: Secondary | ICD-10-CM | POA: Diagnosis not present

## 2023-05-16 DIAGNOSIS — I2510 Atherosclerotic heart disease of native coronary artery without angina pectoris: Secondary | ICD-10-CM | POA: Diagnosis not present

## 2023-05-16 DIAGNOSIS — K219 Gastro-esophageal reflux disease without esophagitis: Secondary | ICD-10-CM | POA: Diagnosis not present

## 2023-05-16 DIAGNOSIS — M25761 Osteophyte, right knee: Secondary | ICD-10-CM | POA: Diagnosis not present

## 2023-05-16 DIAGNOSIS — E785 Hyperlipidemia, unspecified: Secondary | ICD-10-CM | POA: Diagnosis not present

## 2023-05-16 DIAGNOSIS — M1711 Unilateral primary osteoarthritis, right knee: Secondary | ICD-10-CM | POA: Diagnosis not present

## 2023-05-17 ENCOUNTER — Encounter: Payer: Self-pay | Admitting: Physical Therapy

## 2023-05-17 ENCOUNTER — Ambulatory Visit: Payer: No Typology Code available for payment source | Attending: Orthopaedic Surgery | Admitting: Physical Therapy

## 2023-05-17 ENCOUNTER — Other Ambulatory Visit: Payer: Self-pay

## 2023-05-17 DIAGNOSIS — M25661 Stiffness of right knee, not elsewhere classified: Secondary | ICD-10-CM | POA: Insufficient documentation

## 2023-05-17 DIAGNOSIS — G8929 Other chronic pain: Secondary | ICD-10-CM | POA: Diagnosis present

## 2023-05-17 DIAGNOSIS — M25561 Pain in right knee: Secondary | ICD-10-CM | POA: Insufficient documentation

## 2023-05-17 DIAGNOSIS — R600 Localized edema: Secondary | ICD-10-CM | POA: Diagnosis present

## 2023-05-17 NOTE — Therapy (Signed)
OUTPATIENT PHYSICAL THERAPY LOWER EXTREMITY EVALUATION   Patient Name: Ryan Mckee MRN: [id number] DOB:August 09, 1950, 73 y.o., male Today's Date: 05/17/2023  END OF SESSION:  PT End of Session - 05/17/23 1018     Visit Number 1    Number of Visits 15    Date for PT Re-Evaluation 07/12/23    Authorization Type FOTO    PT Start Time [id number]    Activity Tolerance Patient tolerated treatment well    Behavior During Therapy [facility] for tasks assessed/performed             Past Medical History:  Diagnosis Date   CAD (coronary artery disease) 2007   BMS to mid RCA and DES to mid LAD 2007   Essential hypertension    GERD (gastroesophageal reflux disease)    Mixed hyperlipidemia    Type 2 diabetes mellitus (HCC)    Past Surgical History:  Procedure Laterality Date   GALLBLADDER SURGERY     Renal stent intervention     TONSILLECTOMY     There are no problems to display for this patient.  REFERRING PROVIDER: Kenna Gilbert MD  REFERRING DIAG: Primary OA of right knee, right total knee replacement.  THERAPY DIAG:  Chronic pain of right knee  Stiffness of right knee, not elsewhere classified  Localized edema  Rationale for Evaluation and Treatment: Rehabilitation  ONSET DATE: 05/15/23 (surgery date).  SUBJECTIVE:   SUBJECTIVE STATEMENT: The patient presents to the clinic s/p right total knee replacement performed on 05/15/23.  He is walking with a FWW, TED hose donned and his Aquacel is intact (can be removed on 05/25/23).  He has been compliant to a HEP.  He states his pain is up a bit from yesterday rated at a 6/10 and described as burning, sore and stiff.    PERTINENT HISTORY: DM. PAIN:  Are you having pain? Yes: NPRS scale: 6/10 Pain location: Right knee. Pain description: As above. Aggravating factors: Movement, bending. Relieving factors: Rest and ice.    PRECAUTIONS: Other: No ultrasound.  WEIGHT BEARING RESTRICTIONS: No  FALLS:  Has patient fallen in last 6  months? No  LIVING ENVIRONMENT: Lives with: lives with their spouse Lives in: House/apartment Stairs: No Has following equipment at home: Dan Humphreys - 2 wheeled  OCCUPATION: Retired.  PLOF: Independent  PATIENT GOALS: Get out of pain.   OBJECTIVE:   PATIENT SURVEYS:  FOTO     EDEMA:  Circumferential: 5 cms.  PALPATION: Mild diffuse anterior right knee pain.  LOWER EXTREMITY ROM: In supine:  -10 degrees to 95 degrees.  LOWER EXTREMITY MMT:  Patient able to perform a right antigravity SLR and SAQ.  GAIT: Safe gait with decreased step and stride length using a FWW.   TODAY'S TREATMENT:                                                                                                                              DATE: LE elevation  and vasopneumatic on low x 15 minutes to patient's right knee.    PATIENT EDUCATION:  Education details: Patient compliant to HEP from hospital. Person educated: Patient Education method: Explanation Education comprehension: verbalized understanding  HOME EXERCISE PROGRAM: See above.  ASSESSMENT:  CLINICAL IMPRESSION: The patient presents to OPPT s/p right total knee replacement performed on 05/15/23.  He is pleased with his progress thus far.  He is complinat to his HEP has TED donned and Aquacel intact.  Today, his motion was -10 to 95 degrees.  He is performing antigravity SAQ's and SLR's.  He has a moderate amount of edema currently. He is walking safely with a FWW.  Patient will benefit from skilled physical therapy intervention to address pain and deficits.  OBJECTIVE IMPAIRMENTS: Abnormal gait, decreased activity tolerance, decreased ROM, increased edema, and pain.   ACTIVITY LIMITATIONS: carrying, lifting, bending, and locomotion level  PARTICIPATION LIMITATIONS: meal prep, cleaning, and laundry  REHAB POTENTIAL: Excellent  CLINICAL DECISION MAKING: Stable/uncomplicated  EVALUATION COMPLEXITY: Low   GOALS:  SHORT TERM  GOALS: Target date: 05/31/23  Ind with an initial HEP. Goal status: INITIAL  2.  Full active right knee extension.  Goal status: INITIAL  3.  Active right knee flexion to 105 degrees.  Goal status: INITIAL  LONG TERM GOALS: Target date: 07/12/23  Ind with an advanced HEP.  Goal status: INITIAL  2.  Active right knee flexion to 120 degrees+ so the patient can perform functional tasks and do so with pain not > 2-3/10.  Goal status: INITIAL  3.  Increase right hip and knee strength to a 5/5 to provide good stability for accomplishment of functional activities.  Goal status: INITIAL  4.  Walk a community distance with pain not > 2-3/10.  Goal status: INITIAL  5.  Perform ADL's with pain not > 3/10.  Goal status: INITIAL  PLAN:  PT FREQUENCY: per [facility] 15 visits approved.  PT DURATION: other: 7-8 weeks.  PLANNED INTERVENTIONS: Therapeutic exercises, Therapeutic activity, Neuromuscular re-education, Gait training, Patient/Family education, Self Care, Electrical stimulation, Cryotherapy, Vasopneumatic device, and Manual therapy  PLAN FOR NEXT SESSION: Nustep.  Progress per TKA protocol.     Jency Schnieders, [place], PT 05/17/2023, 11:21 AM

## 2023-05-21 ENCOUNTER — Encounter: Payer: Self-pay | Admitting: Physical Therapy

## 2023-05-21 ENCOUNTER — Ambulatory Visit: Payer: No Typology Code available for payment source | Admitting: Physical Therapy

## 2023-05-21 DIAGNOSIS — R600 Localized edema: Secondary | ICD-10-CM

## 2023-05-21 DIAGNOSIS — M25561 Pain in right knee: Secondary | ICD-10-CM | POA: Diagnosis not present

## 2023-05-21 DIAGNOSIS — M25661 Stiffness of right knee, not elsewhere classified: Secondary | ICD-10-CM

## 2023-05-21 DIAGNOSIS — G8929 Other chronic pain: Secondary | ICD-10-CM

## 2023-05-21 NOTE — Therapy (Signed)
OUTPATIENT PHYSICAL THERAPY LOWER EXTREMITY TREATMENT   Patient Name: Ryan Mckee MRN: [id number] DOB:[birth year], 73 y.o., male Today's Date: 05/21/2023  END OF SESSION:  PT End of Session - 05/21/23 1354     Visit Number 2    Number of Visits 15    Date for PT Re-Evaluation 07/12/23    Authorization Type FOTO    PT Start Time 1349    PT Stop Time 1434    PT Time Calculation (min) 45 min    Activity Tolerance Patient tolerated treatment well    Behavior During Therapy [facility] for tasks assessed/performed            Past Medical History:  Diagnosis Date   CAD (coronary artery disease) 2007   BMS to mid RCA and DES to mid LAD 2007   Essential hypertension    GERD (gastroesophageal reflux disease)    Mixed hyperlipidemia    Type 2 diabetes mellitus (HCC)    Past Surgical History:  Procedure Laterality Date   GALLBLADDER SURGERY     Renal stent intervention     TONSILLECTOMY     There are no problems to display for this patient.  REFERRING PROVIDER: Kenna Gilbert MD  REFERRING DIAG: Primary OA of right knee, right total knee replacement.  THERAPY DIAG:  Chronic pain of right knee  Stiffness of right knee, not elsewhere classified  Localized edema  Rationale for Evaluation and Treatment: Rehabilitation  ONSET DATE: 05/15/23 (surgery date).  SUBJECTIVE:   SUBJECTIVE STATEMENT: Reports more pain over the weekend. Staying up on med schedule as far as he knows.  PERTINENT HISTORY: DM.  PAIN:  Are you having pain? Yes: NPRS scale: 7/10 Pain location: Right knee. Pain description: As above. Aggravating factors: Movement, bending. Relieving factors: Rest and ice.    PRECAUTIONS: Other: No ultrasound.  WEIGHT BEARING RESTRICTIONS: No  FALLS:  Has patient fallen in last 6 months? No  LIVING ENVIRONMENT: Lives with: lives with their spouse Lives in: House/apartment Stairs: No Has following equipment at home: Dan Humphreys - 2 wheeled  OCCUPATION:  Retired.  PLOF: Independent  PATIENT GOALS: Get out of pain.  OBJECTIVE:   PATIENT SURVEYS:  FOTO 36  EDEMA:  Circumferential: 5 cms.  PALPATION: Mild diffuse anterior right knee pain.  LOWER EXTREMITY ROM: In supine:  -10 degrees to 95 degrees.  LOWER EXTREMITY MMT: Patient able to perform a right antigravity SLR and SAQ.  GAIT: Safe gait with decreased step and stride length using a FWW.  TODAY'S TREATMENT:                                                                                                                              DATE: 05/21/2023   EXERCISE LOG  Exercise Repetitions and Resistance Comments  Nustep L3, seat 11 x15 min   Rockerboard X2 min   Lunges 6" step x15 reps Limited by lightheaded sensation and nausea  SAQ X15 reps  Heel prop for ext X3 min    Blank cell = exercise not performed today   Modalities  Date: 05/21/23 Vaso: Knee, Med, 15 mins, Pain and Edema  PATIENT EDUCATION:  Education details: Patient compliant to HEP from hospital. Person educated: Patient Education method: Explanation Education comprehension: verbalized understanding  HOME EXERCISE PROGRAM: See above.  ASSESSMENT:  CLINICAL IMPRESSION: Patient presented in clinic with reports of increased R knee pain currently but especially over the weekend. Patient observed with slow transfers with lethargy and pain. Patient guided through light ROM exercises but stopped standing exercises due to reports of lightheadedness and nausea. Patient was returned to sitting position and provided a cool, damp towel to cool himself. Patient stated that after resting in sitting, the symptoms subsided. Patient stated that he had been experiencing those symptoms more today. Patient reported feeling relief of pain with heel prop into extension. Normal vasopneumatic response noted following removal of the modality. R knee flexion noted in stance phase of gait.  OBJECTIVE IMPAIRMENTS: Abnormal gait,  decreased activity tolerance, decreased ROM, increased edema, and pain.   ACTIVITY LIMITATIONS: carrying, lifting, bending, and locomotion level  PARTICIPATION LIMITATIONS: meal prep, cleaning, and laundry  REHAB POTENTIAL: Excellent  CLINICAL DECISION MAKING: Stable/uncomplicated  EVALUATION COMPLEXITY: Low  GOALS:  SHORT TERM GOALS: Target date: 05/31/23  Ind with an initial HEP. Goal status: On-going  2.  Full active right knee extension.  Goal status: On-going  3.  Active right knee flexion to 105 degrees.  Goal status: On-going  LONG TERM GOALS: Target date: 07/12/23  Ind with an advanced HEP.  Goal status: On-going  2.  Active right knee flexion to 120 degrees+ so the patient can perform functional tasks and do so with pain not > 2-3/10.  Goal status: On-going  3.  Increase right hip and knee strength to a 5/5 to provide good stability for accomplishment of functional activities.  Goal status: On-going  4.  Walk a community distance with pain not > 2-3/10.  Goal status: On-going  5.  Perform ADL's with pain not > 3/10.  Goal status: On-going  PLAN:  PT FREQUENCY: per [facility] 15 visits approved.  PT DURATION: other: 7-8 weeks.  PLANNED INTERVENTIONS: Therapeutic exercises, Therapeutic activity, Neuromuscular re-education, Gait training, Patient/Family education, Self Care, Electrical stimulation, Cryotherapy, Vasopneumatic device, and Manual therapy  PLAN FOR NEXT SESSION: Nustep.  Progress per TKA protocol.    Marvell Fuller, PTA 05/21/2023, 2:38 PM

## 2023-05-23 ENCOUNTER — Ambulatory Visit: Payer: No Typology Code available for payment source

## 2023-05-23 DIAGNOSIS — G8929 Other chronic pain: Secondary | ICD-10-CM

## 2023-05-23 DIAGNOSIS — M25561 Pain in right knee: Secondary | ICD-10-CM | POA: Diagnosis not present

## 2023-05-23 DIAGNOSIS — R600 Localized edema: Secondary | ICD-10-CM

## 2023-05-23 DIAGNOSIS — M25661 Stiffness of right knee, not elsewhere classified: Secondary | ICD-10-CM

## 2023-05-23 NOTE — Therapy (Signed)
OUTPATIENT PHYSICAL THERAPY LOWER EXTREMITY TREATMENT   Patient Name: Ryan Mckee MRN: [id number] DOB:Mar 05, 1950, 73 y.o., male Today's Date: 05/23/2023  END OF SESSION:  PT End of Session - 05/23/23 1526     Visit Number 3    Number of Visits 15    Date for PT Re-Evaluation 07/12/23    Authorization Type FOTO    PT Start Time 1515    PT Stop Time 1602    PT Time Calculation (min) 47 min    Activity Tolerance Patient tolerated treatment well    Behavior During Therapy [facility] for tasks assessed/performed            Past Medical History:  Diagnosis Date   CAD (coronary artery disease) 2007   BMS to mid RCA and DES to mid LAD 2007   Essential hypertension    GERD (gastroesophageal reflux disease)    Mixed hyperlipidemia    Type 2 diabetes mellitus (HCC)    Past Surgical History:  Procedure Laterality Date   GALLBLADDER SURGERY     Renal stent intervention     TONSILLECTOMY     There are no problems to display for this patient.  REFERRING PROVIDER: Kenna Gilbert MD  REFERRING DIAG: Primary OA of right knee, right total knee replacement.  THERAPY DIAG:  Chronic pain of right knee  Stiffness of right knee, not elsewhere classified  Localized edema  Rationale for Evaluation and Treatment: Rehabilitation  ONSET DATE: 05/15/23 (surgery date).  SUBJECTIVE:   SUBJECTIVE STATEMENT: Pt reports feeling a little better today.    PERTINENT HISTORY: DM.  PAIN:  Are you having pain? Yes: NPRS scale: 6/10 Pain location: Right knee. Pain description: As above. Aggravating factors: Movement, bending. Relieving factors: Rest and ice.    PRECAUTIONS: Other: No ultrasound.  WEIGHT BEARING RESTRICTIONS: No  FALLS:  Has patient fallen in last 6 months? No  LIVING ENVIRONMENT: Lives with: lives with their spouse Lives in: House/apartment Stairs: No Has following equipment at home: Dan Humphreys - 2 wheeled  OCCUPATION: Retired.  PLOF: Independent  PATIENT GOALS:  Get out of pain.  OBJECTIVE:   PATIENT SURVEYS:  FOTO 36  EDEMA:  Circumferential: 5 cms.  PALPATION: Mild diffuse anterior right knee pain.  LOWER EXTREMITY ROM: In supine:  -10 degrees to 95 degrees.  LOWER EXTREMITY MMT: Patient able to perform a right antigravity SLR and SAQ.  GAIT: Safe gait with decreased step and stride length using a FWW.  TODAY'S TREATMENT:                                                                                                                              DATE: 05/23/2023   EXERCISE LOG  Exercise Repetitions and Resistance Comments  Nustep L3, seat 10 x15 min   Rockerboard X2.5 min   Heel/Toe Raises X30 reps each   Lunges 6" step x20  reps Limited by lightheaded sensation and nausea  SAQ X20 reps  Heel prop for ext X3.5 min    Blank cell = exercise not performed today   Modalities  Date: 05/23/23 Vaso: Knee, Med, 15 mins, Pain and Edema  PATIENT EDUCATION:  Education details: Patient compliant to HEP from hospital. Person educated: Patient Education method: Explanation Education comprehension: verbalized understanding  HOME EXERCISE PROGRAM: See above.  ASSESSMENT:  CLINICAL IMPRESSION: Pt arrives for today's treatment session reporting 8/10 right knee pain.  Pt able to tolerate standing exercises today without any side effects or having to stop due to feeling bad.  Pt able to performed increased reps or time with SAQ and static extension stretch without issue.  Normal responses to vaso noted upon removal.  Pt reported 6/10 right knee pain at completion of today's treatment session.   OBJECTIVE IMPAIRMENTS: Abnormal gait, decreased activity tolerance, decreased ROM, increased edema, and pain.   ACTIVITY LIMITATIONS: carrying, lifting, bending, and locomotion level  PARTICIPATION LIMITATIONS: meal prep, cleaning, and laundry  REHAB POTENTIAL: Excellent  CLINICAL DECISION MAKING: Stable/uncomplicated  EVALUATION COMPLEXITY:  Low  GOALS:  SHORT TERM GOALS: Target date: 05/31/23  Ind with an initial HEP. Goal status: On-going  2.  Full active right knee extension.  Goal status: On-going  3.  Active right knee flexion to 105 degrees.  Goal status: On-going  LONG TERM GOALS: Target date: 07/12/23  Ind with an advanced HEP.  Goal status: On-going  2.  Active right knee flexion to 120 degrees+ so the patient can perform functional tasks and do so with pain not > 2-3/10.  Goal status: On-going  3.  Increase right hip and knee strength to a 5/5 to provide good stability for accomplishment of functional activities.  Goal status: On-going  4.  Walk a community distance with pain not > 2-3/10.  Goal status: On-going  5.  Perform ADL's with pain not > 3/10.  Goal status: On-going  PLAN:  PT [facility]REQUENC[facility]: per [facility] visits approved.  PT DURATION: other: 7-8 weeks.  PLANNED INTERVENTIONS: Therapeutic exercises, Therapeutic activity, Neuromuscular re-education, Gait training, Patient/Family education, Self Care, Electrical stimulation, Cryotherapy, Vasopneumatic device, and Manual therapy  PLAN FOR NEXT SESSION: Nustep.  Progress per TKA protocol.    Newman Pies, PTA 05/23/2023, 4:13 PM

## 2023-05-28 ENCOUNTER — Ambulatory Visit: Payer: No Typology Code available for payment source | Admitting: Physical Therapy

## 2023-05-28 ENCOUNTER — Encounter: Payer: Self-pay | Admitting: Physical Therapy

## 2023-05-28 DIAGNOSIS — G8929 Other chronic pain: Secondary | ICD-10-CM

## 2023-05-28 DIAGNOSIS — R600 Localized edema: Secondary | ICD-10-CM

## 2023-05-28 DIAGNOSIS — M25661 Stiffness of right knee, not elsewhere classified: Secondary | ICD-10-CM

## 2023-05-28 DIAGNOSIS — M25561 Pain in right knee: Secondary | ICD-10-CM | POA: Diagnosis not present

## 2023-05-28 NOTE — Therapy (Signed)
OUTPATIENT PHYSICAL THERAPY LOWER EXTREMITY TREATMENT   Patient Name: Ryan Mckee MRN: [id number] DOB:17-Dec-1950, 73 y.o., male Today's Date: 05/28/2023  END OF SESSION:  PT End of Session - 05/28/23 1433     Visit Number 4    Number of Visits 15    Date for PT Re-Evaluation 07/12/23    Authorization Type FOTO    PT Start Time 1431    PT Stop Time 1513    PT Time Calculation (min) 42 min    Activity Tolerance Patient tolerated treatment well    Behavior During Therapy [facility] for tasks assessed/performed            Past Medical History:  Diagnosis Date   CAD (coronary artery disease) 2007   BMS to mid RCA and DES to mid LAD 2007   Essential hypertension    GERD (gastroesophageal reflux disease)    Mixed hyperlipidemia    Type 2 diabetes mellitus (HCC)    Past Surgical History:  Procedure Laterality Date   GALLBLADDER SURGERY     Renal stent intervention     TONSILLECTOMY     There are no problems to display for this patient.  REFERRING PROVIDER: Kenna Gilbert MD  REFERRING DIAG: Primary OA of right knee, right total knee replacement.  THERAPY DIAG:  Chronic pain of right knee  Stiffness of right knee, not elsewhere classified  Localized edema  Rationale for Evaluation and Treatment: Rehabilitation  ONSET DATE: 05/15/23 (surgery date).  SUBJECTIVE:   SUBJECTIVE STATEMENT: Pt reports feeling a little better today. Just came back from [facility] from [place]. Got a good report from [facility].  PERTINENT HISTORY: DM.  PAIN:  Are you having pain? Yes: NPRS scale: 7/10 Pain location: Right knee. Pain description: As above. Aggravating factors: Movement, bending. Relieving factors: Rest and ice.    PRECAUTIONS: Other: No ultrasound.  PATIENT GOALS: Get out of pain.  OBJECTIVE:   PATIENT SURVEYS:  FOTO 36  EDEMA:  Circumferential: R knee 41.5 cm, L knee 36.7 cm.  PALPATION: Mild diffuse anterior right knee pain.  LOWER EXTREMITY ROM:     Active   Right eval Right AROM 05/28/2023  Hip flexion    Hip extension    Hip abduction    Hip adduction    Hip internal rotation    Hip external rotation    Knee flexion 95 95  Knee extension 10 5  Ankle dorsiflexion    Ankle plantarflexion    Ankle inversion    Ankle eversion     (Blank rows = not tested)   LOWER EXTREMITY MMT: Patient able to perform a right antigravity SLR and SAQ.  GAIT: Safe gait with decreased step and stride length using a FWW.  TODAY'S TREATMENT:                                                                                                                              DATE: 05/28/2023  EXERCISE LOG  Exercise Repetitions and Resistance Comments  Nustep L3, seat 10 x15 min   Rockerboard X2 min   Heel raises X20 reps each   Lunges 6" step x20  reps   Retrostepping RLE x20 reps for knee extension   LAQ X20 reps AROM   Heel slides Supine x20 reps    Blank cell = exercise not performed today   PATIENT EDUCATION:  Education details: Patient compliant to HEP from hospital. Person educated: Patient Education method: Explanation Education comprehension: verbalized understanding  HOME EXERCISE PROGRAM: See above.  ASSESSMENT:  CLINICAL IMPRESSION: Patient presented in clinic with increased discomfort but has been riding in the car and going into appointments. Patient progressed to more active strengthening and ROM exercises. Continued edema of the R knee notable and active R knee extension has improved since evaluation. Patient still using FWW at this time for ambulation. Tightness of R HS palpable during ROM and edema assessment. Patient encouraged to continue using ice at home multiple times per day for edema reduction.   OBJECTIVE IMPAIRMENTS: Abnormal gait, decreased activity tolerance, decreased ROM, increased edema, and pain.   ACTIVITY LIMITATIONS: carrying, lifting, bending, and locomotion level  PARTICIPATION LIMITATIONS: meal prep, cleaning, and  laundry  REHAB POTENTIAL: Excellent  CLINICAL DECISION MAKING: Stable/uncomplicated  EVALUATION COMPLEXITY: Low  GOALS:  SHORT TERM GOALS: Target date: 05/31/23  Ind with an initial HEP. Goal status: On-going  2.  Full active right knee extension.  Goal status: On-going  3.  Active right knee flexion to 105 degrees.  Goal status: On-going  LONG TERM GOALS: Target date: 07/12/23  Ind with an advanced HEP.  Goal status: On-going  2.  Active right knee flexion to 120 degrees+ so the patient can perform functional tasks and do so with pain not > 2-3/10.  Goal status: On-going  3.  Increase right hip and knee strength to a 5/5 to provide good stability for accomplishment of functional activities.  Goal status: On-going  4.  Walk a community distance with pain not > 2-3/10.  Goal status: On-going  5.  Perform ADL's with pain not > 3/10.  Goal status: On-going  PLAN:  PT FREQUENCY: per [facility] 15 visits approved.  PT DURATION: other: 7-8 weeks.  PLANNED INTERVENTIONS: Therapeutic exercises, Therapeutic activity, Neuromuscular re-education, Gait training, Patient/Family education, Self Care, Electrical stimulation, Cryotherapy, Vasopneumatic device, and Manual therapy  PLAN FOR NEXT SESSION: Nustep.  Progress per TKA protocol.    Marvell Fuller, PTA 05/28/2023, 3:43 PM

## 2023-05-30 ENCOUNTER — Ambulatory Visit: Payer: No Typology Code available for payment source

## 2023-05-30 DIAGNOSIS — M25561 Pain in right knee: Secondary | ICD-10-CM | POA: Diagnosis not present

## 2023-05-30 DIAGNOSIS — M25661 Stiffness of right knee, not elsewhere classified: Secondary | ICD-10-CM

## 2023-05-30 DIAGNOSIS — R600 Localized edema: Secondary | ICD-10-CM

## 2023-05-30 DIAGNOSIS — G8929 Other chronic pain: Secondary | ICD-10-CM

## 2023-05-30 NOTE — Therapy (Signed)
OUTPATIENT PHYSICAL THERAPY LOWER EXTREMITY TREATMENT   Patient Name: Ryan Mckee MRN: [id number] DOB:Aug 05, 1950, 73 y.o., male Today's Date: 05/30/2023  END OF SESSION:  PT End of Session - 05/30/23 1435     Visit Number 5    Number of Visits 15    Date for PT Re-Evaluation 07/12/23    Authorization Type FOTO    PT Start Time 1430    PT Stop Time 1515    PT Time Calculation (min) 45 min    Activity Tolerance Patient tolerated treatment well    Behavior During Therapy [facility] for tasks assessed/performed            Past Medical History:  Diagnosis Date   CAD (coronary artery disease) 2007   BMS to mid RCA and DES to mid LAD 2007   Essential hypertension    GERD (gastroesophageal reflux disease)    Mixed hyperlipidemia    Type 2 diabetes mellitus (HCC)    Past Surgical History:  Procedure Laterality Date   GALLBLADDER SURGERY     Renal stent intervention     TONSILLECTOMY     There are no problems to display for this patient.  REFERRING PROVIDER: Kenna Gilbert MD  REFERRING DIAG: Primary OA of right knee, right total knee replacement.  THERAPY DIAG:  Chronic pain of right knee  Stiffness of right knee, not elsewhere classified  Localized edema  Rationale for Evaluation and Treatment: Rehabilitation  ONSET DATE: 05/15/23 (surgery date).  SUBJECTIVE:   SUBJECTIVE STATEMENT: Patient reports that his knee is hurting some as he is not taking his pain medication as much. He has noticed that his knee hurts more the day after his appointments. However, he has noticed some improved movement.   PERTINENT HISTORY: DM.  PAIN:  Are you having pain? Yes: NPRS scale: 6/10 Pain location: Right knee. Pain description: As above. Aggravating factors: Movement, bending. Relieving factors: Rest and ice.    PRECAUTIONS: Other: No ultrasound.  PATIENT GOALS: Get out of pain.  OBJECTIVE:   PATIENT SURVEYS:  FOTO 36  EDEMA:  Circumferential: R knee 41.5 cm, L knee  36.7 cm.  PALPATION: Mild diffuse anterior right knee pain.  LOWER EXTREMITY ROM:     Active  Right eval Right AROM 05/28/2023  Hip flexion    Hip extension    Hip abduction    Hip adduction    Hip internal rotation    Hip external rotation    Knee flexion 95 95  Knee extension 10 5  Ankle dorsiflexion    Ankle plantarflexion    Ankle inversion    Ankle eversion     (Blank rows = not tested)   LOWER EXTREMITY MMT: Patient able to perform a right antigravity SLR and SAQ.  GAIT: Safe gait with decreased step and stride length using a FWW.  TODAY'S TREATMENT:  DATE:                                    5/30 EXERCISE LOG  Exercise Repetitions and Resistance Comments  Nustep L3-5 x 15 minutes; seat 10    LAQ 2 minutes   Seated HS isometric  2 minutes w/ 5 second hold    Seated HS stretch  4 x 30 seconds   Lunges onto step  8" step x 2 minutes   Gastroc stretch  2 minutes   Marching on foam  2 minutes    Blank cell = exercise not performed today  Manual Therapy Soft Tissue Mobilization: right quadriceps and hamstring , for improved soft tissue extensibility Joint Mobilizations: tibiofemoral, grade I-III AP (seated) Passive ROM: right knee flexion, to tolerance    05/28/2023   EXERCISE LOG  Exercise Repetitions and Resistance Comments  Nustep L3, seat 10 x15 min   Rockerboard X2 min   Heel raises X20 reps each   Lunges 6" step x20  reps   Retrostepping RLE x20 reps for knee extension   LAQ X20 reps AROM   Heel slides Supine x20 reps    Blank cell = exercise not performed today   PATIENT EDUCATION:  Education details: Patient compliant to HEP from hospital. Person educated: Patient Education method: Explanation Education comprehension: verbalized understanding  HOME EXERCISE PROGRAM: See above.  ASSESSMENT:  CLINICAL  IMPRESSION: Patient was progressed with multiple new interventions for improved knee flexion. He required minimal cueing with a seated hamstring stretch for proper positioning to facilitate improved soft tissue extensibility. Manual therapy focused on soft tissue mobilization to his right hamstring as this was moderately effective at reducing his familiar pain and improving his knee flexion. This was followed by appropriately matched interventions for improved hamstring soft tissue extensibility. He reported feeling "like I worked out" upon the conclusion of treatment. He continues to require skilled physical therapy to address his remaining impairments to return to his prior level of function.   OBJECTIVE IMPAIRMENTS: Abnormal gait, decreased activity tolerance, decreased ROM, increased edema, and pain.   ACTIVITY LIMITATIONS: carrying, lifting, bending, and locomotion level  PARTICIPATION LIMITATIONS: meal prep, cleaning, and laundry  REHAB POTENTIAL: Excellent  CLINICAL DECISION MAKING: Stable/uncomplicated  EVALUATION COMPLEXITY: Low  GOALS:  SHORT TERM GOALS: Target date: 05/31/23  Ind with an initial HEP. Goal status: On-going  2.  Full active right knee extension.  Goal status: On-going  3.  Active right knee flexion to 105 degrees.  Goal status: On-going  LONG TERM GOALS: Target date: 07/12/23  Ind with an advanced HEP.  Goal status: On-going  2.  Active right knee flexion to 120 degrees+ so the patient can perform functional tasks and do so with pain not > 2-3/10.  Goal status: On-going  3.  Increase right hip and knee strength to a 5/5 to provide good stability for accomplishment of functional activities.  Goal status: On-going  4.  Walk a community distance with pain not > 2-3/10.  Goal status: On-going  5.  Perform ADL's with pain not > 3/10.  Goal status: On-going  PLAN:  PT FREQUENCY: per [facility] 15 visits approved.  PT DURATION: other: 7-8 weeks.  PLANNED  INTERVENTIONS: Therapeutic exercises, Therapeutic activity, Neuromuscular re-education, Gait training, Patient/Family education, Self Care, Electrical stimulation, Cryotherapy, Vasopneumatic device, and Manual therapy  PLAN FOR NEXT SESSION: Nustep.  Progress per TKA protocol.  Granville Lewis, PT 05/30/2023, 3:59 PM

## 2023-06-04 ENCOUNTER — Encounter: Payer: Self-pay | Admitting: Physical Therapy

## 2023-06-04 ENCOUNTER — Ambulatory Visit: Payer: No Typology Code available for payment source | Attending: Orthopaedic Surgery | Admitting: Physical Therapy

## 2023-06-04 DIAGNOSIS — G8929 Other chronic pain: Secondary | ICD-10-CM | POA: Insufficient documentation

## 2023-06-04 DIAGNOSIS — M25661 Stiffness of right knee, not elsewhere classified: Secondary | ICD-10-CM | POA: Insufficient documentation

## 2023-06-04 DIAGNOSIS — M25561 Pain in right knee: Secondary | ICD-10-CM | POA: Insufficient documentation

## 2023-06-04 DIAGNOSIS — R600 Localized edema: Secondary | ICD-10-CM | POA: Insufficient documentation

## 2023-06-04 NOTE — Therapy (Signed)
OUTPATIENT PHYSICAL THERAPY LOWER EXTREMITY TREATMENT   Patient Name: Ryan Mckee MRN: [id number] DOB:March 10, 1950, 73 y.o., male Today's Date: 06/04/2023  END OF SESSION:  PT End of Session - 06/04/23 1513     Visit Number 6    Number of Visits 15    Date for PT Re-Evaluation 07/12/23    Authorization Type FOTO    PT Start Time 1516    PT Stop Time 1559    PT Time Calculation (min) 43 min    Activity Tolerance Patient tolerated treatment well    Behavior During Therapy [facility] for tasks assessed/performed            Past Medical History:  Diagnosis Date   CAD (coronary artery disease) 2007   BMS to mid RCA and DES to mid LAD 2007   Essential hypertension    GERD (gastroesophageal reflux disease)    Mixed hyperlipidemia    Type 2 diabetes mellitus (HCC)    Past Surgical History:  Procedure Laterality Date   GALLBLADDER SURGERY     Renal stent intervention     TONSILLECTOMY     There are no problems to display for this patient.  REFERRING PROVIDER: Kenna Gilbert MD  REFERRING DIAG: Primary OA of right knee, right total knee replacement.  THERAPY DIAG:  Chronic pain of right knee  Stiffness of right knee, not elsewhere classified  Localized edema  Rationale for Evaluation and Treatment: Rehabilitation  ONSET DATE: 05/15/23 (surgery date).  SUBJECTIVE:   SUBJECTIVE STATEMENT: Reports that he was wondering whether he could progress to a cane. Was using a cane prior to TKR.  PERTINENT HISTORY: DM.  PAIN:  Are you having pain? Yes: NPRS scale: 3-4/10 Pain location: Right knee. Pain description: As above. Aggravating factors: Movement, bending. Relieving factors: Rest and ice.    PRECAUTIONS: Other: No ultrasound.  PATIENT GOALS: Get out of pain.  OBJECTIVE:   PATIENT SURVEYS:  FOTO 60  EDEMA:  Circumferential: R knee 41.5 cm, L knee 36.4 cm.  PALPATION: Tenderness of R calf  LOWER EXTREMITY ROM:     Active  Right eval Right AROM 05/28/2023  Right AROM 06/04/23  Hip flexion     Hip extension     Hip abduction     Hip adduction     Hip internal rotation     Hip external rotation     Knee flexion 95 95   Knee extension 10 5 5   Ankle dorsiflexion     Ankle plantarflexion     Ankle inversion     Ankle eversion      (Blank rows = not tested)   LOWER EXTREMITY MMT: Patient able to perform a right antigravity SLR and SAQ.  GAIT: Safe gait with decreased step and stride length using a FWW.  TODAY'S TREATMENT:  DATE:   6/4 EXERCISE LOG  Exercise Repetitions and Resistance Comments  Nustep L4, seat 6 x 15 minutes   Rockerboard X3 min   Lunges 8" step x20 reps   Forward step up 6" step x20 reps   Lateral step up 6" step x20 reps   LAQ 3# x20 reps   Heel prop for ext X3 min    Blank cell = exercise not performed today   PATIENT EDUCATION:  Education details: 3 pt gait cycle and importance of continuing ice for swelling Person educated: Patient Education method: Explanation Education comprehension: verbalized understanding  HOME EXERCISE PROGRAM: See above.  ASSESSMENT:  CLINICAL IMPRESSION: Patient presented in clinic with mild L knee pain although reporting initial rounds of Nustep closer seat for greater knee flexion was painful. TED hose donned to RLE. Patient progressed to functional activities such as step ups. Patient demonstrates a mild deficit with quad activation as well as resting knee extension. Mild edema notable surrounding R knee and importance of ice and elevation were instructed to patient. PTA also demonstrated new 3 pt gait pattern with emphasis on heel/toe sequencing and allowing for knee flexion during gait. Patient does have two SPC at home.  OBJECTIVE IMPAIRMENTS: Abnormal gait, decreased activity tolerance, decreased ROM, increased edema, and pain.   ACTIVITY LIMITATIONS:  carrying, lifting, bending, and locomotion level  PARTICIPATION LIMITATIONS: meal prep, cleaning, and laundry  REHAB POTENTIAL: Excellent  CLINICAL DECISION MAKING: Stable/uncomplicated  EVALUATION COMPLEXITY: Low  GOALS:  SHORT TERM GOALS: Target date: 05/31/23  Ind with an initial HEP. Goal status: On-going  2.  Full active right knee extension.  Goal status: On-going  3.  Active right knee flexion to 105 degrees.  Goal status: On-going  LONG TERM GOALS: Target date: 07/12/23  Ind with an advanced HEP.  Goal status: On-going  2.  Active right knee flexion to 120 degrees+ so the patient can perform functional tasks and do so with pain not > 2-3/10.  Goal status: On-going  3.  Increase right hip and knee strength to a 5/5 to provide good stability for accomplishment of functional activities.  Goal status: On-going  4.  Walk a community distance with pain not > 2-3/10.  Goal status: On-going  5.  Perform ADL's with pain not > 3/10.  Goal status: On-going  PLAN:  PT FREQUENCY: per [facility] 15 visits approved.  PT DURATION: other: 7-8 weeks.  PLANNED INTERVENTIONS: Therapeutic exercises, Therapeutic activity, Neuromuscular re-education, Gait training, Patient/Family education, Self Care, Electrical stimulation, Cryotherapy, Vasopneumatic device, and Manual therapy  PLAN FOR NEXT SESSION: Nustep.  Progress per TKA protocol.    Marvell Fuller, PTA 06/04/2023, 4:22 PM

## 2023-06-06 ENCOUNTER — Ambulatory Visit: Payer: No Typology Code available for payment source

## 2023-06-06 DIAGNOSIS — M25661 Stiffness of right knee, not elsewhere classified: Secondary | ICD-10-CM

## 2023-06-06 DIAGNOSIS — M25561 Pain in right knee: Secondary | ICD-10-CM | POA: Diagnosis not present

## 2023-06-06 DIAGNOSIS — R600 Localized edema: Secondary | ICD-10-CM

## 2023-06-06 DIAGNOSIS — G8929 Other chronic pain: Secondary | ICD-10-CM

## 2023-06-06 NOTE — Therapy (Signed)
OUTPATIENT PHYSICAL THERAPY LOWER EXTREMITY TREATMENT   Patient Name: Ryan Mckee MRN: [id number] DOB:[birth year], 73 y.o., male Today's Date: 06/06/2023  END OF SESSION:  PT End of Session - 06/06/23 1644     Visit Number 7    Number of Visits 15    Date for PT Re-Evaluation 07/12/23    Authorization Type FOTO    PT Start Time 1600    PT Stop Time 1643    PT Time Calculation (min) 43 min    Activity Tolerance Patient tolerated treatment well    Behavior During Therapy [facility] for tasks assessed/performed             Past Medical History:  Diagnosis Date   CAD (coronary artery disease) 2007   BMS to mid RCA and DES to mid LAD 2007   Essential hypertension    GERD (gastroesophageal reflux disease)    Mixed hyperlipidemia    Type 2 diabetes mellitus (HCC)    Past Surgical History:  Procedure Laterality Date   GALLBLADDER SURGERY     Renal stent intervention     TONSILLECTOMY     There are no problems to display for this patient.  REFERRING PROVIDER: Kenna Gilbert MD  REFERRING DIAG: Primary OA of right knee, right total knee replacement.  THERAPY DIAG:  Chronic pain of right knee  Stiffness of right knee, not elsewhere classified  Localized edema  Rationale for Evaluation and Treatment: Rehabilitation  ONSET DATE: 05/15/23 (surgery date).  SUBJECTIVE:   SUBJECTIVE STATEMENT: Patient reports that the back of his knee hurts a little when he straightens his knee out.   PERTINENT HISTORY: DM.  PAIN:  Are you having pain? Yes: NPRS scale: 4.5/10 Pain location: Right knee. Pain description: As above. Aggravating factors: Movement, bending. Relieving factors: Rest and ice.    PRECAUTIONS: Other: No ultrasound.  PATIENT GOALS: Get out of pain.  OBJECTIVE:   PATIENT SURVEYS:  FOTO 60  EDEMA:  Circumferential: R knee 41.5 cm, L knee 36.4 cm.  PALPATION: Tenderness of R calf  LOWER EXTREMITY ROM:     Active  Right eval Right AROM 05/28/2023  Right AROM 06/04/23  Hip flexion     Hip extension     Hip abduction     Hip adduction     Hip internal rotation     Hip external rotation     Knee flexion 95 95   Knee extension 10 5 5   Ankle dorsiflexion     Ankle plantarflexion     Ankle inversion     Ankle eversion      (Blank rows = not tested)   LOWER EXTREMITY MMT: Patient able to perform a right antigravity SLR and SAQ.  GAIT: Safe gait with decreased step and stride length using a FWW.  TODAY'S TREATMENT:  DATE:                                   06/06/23 EXERCISE LOG  Exercise Repetitions and Resistance Comments  Nustep  L4 x 15 minutes; seat 9-8   Rocker board  3 minutes   Marching on foam  2 minutes Intermittent UE support  Standing HS stretch  4 x 30 seconds   Standing HS curl 20 reps  RLE only       Blank cell = exercise not performed today  Manual Therapy Soft Tissue Mobilization: right hamstring and hip adductors, for reduced pain and tone Manual Traction: tibiofemoral distraction seated, with PROM        6/4 EXERCISE LOG  Exercise Repetitions and Resistance Comments  Nustep L4, seat 6 x 15 minutes   Rockerboard X3 min   Lunges 8" step x20 reps   Forward step up 6" step x20 reps   Lateral step up 6" step x20 reps   LAQ 3# x20 reps   Heel prop for ext X3 min    Blank cell = exercise not performed today   PATIENT EDUCATION:  Education details: 3 pt gait cycle and importance of continuing ice for swelling Person educated: Patient Education method: Explanation Education comprehension: verbalized understanding  HOME EXERCISE PROGRAM: See above.  ASSESSMENT:  CLINICAL IMPRESSION: Patient was progressed with familiar interventions to facilitate improved knee mobility for improved function with his daily activities. He required minimal cueing marching on foam to reduce his upper  extremity use to facilitate increased demand on his lower extremities. He experienced a mild increase in right knee discomfort with standing hamstring curls, but this was able to be resolved with manual therapy with tibiofemoral distraction with passive range of motion being the most effective. He reported that his knee felt good upon the conclusion of treatment. He continues to require skilled physical therapy to address his remaining impairments to return to his prior level of function.   OBJECTIVE IMPAIRMENTS: Abnormal gait, decreased activity tolerance, decreased ROM, increased edema, and pain.   ACTIVITY LIMITATIONS: carrying, lifting, bending, and locomotion level  PARTICIPATION LIMITATIONS: meal prep, cleaning, and laundry  REHAB POTENTIAL: Excellent  CLINICAL DECISION MAKING: Stable/uncomplicated  EVALUATION COMPLEXITY: Low  GOALS:  SHORT TERM GOALS: Target date: 05/31/23  Ind with an initial HEP. Goal status: On-going  2.  Full active right knee extension.  Goal status: On-going  3.  Active right knee flexion to 105 degrees.  Goal status: On-going  LONG TERM GOALS: Target date: 07/12/23  Ind with an advanced HEP.  Goal status: On-going  2.  Active right knee flexion to 120 degrees+ so the patient can perform functional tasks and do so with pain not > 2-3/10.  Goal status: On-going  3.  Increase right hip and knee strength to a 5/5 to provide good stability for accomplishment of functional activities.  Goal status: On-going  4.  Walk a community distance with pain not > 2-3/10.  Goal status: On-going  5.  Perform ADL's with pain not > 3/10.  Goal status: On-going  PLAN:  PT FREQUENCY: per [facility] 15 visits approved.  PT DURATION: other: 7-8 weeks.  PLANNED INTERVENTIONS: Therapeutic exercises, Therapeutic activity, Neuromuscular re-education, Gait training, Patient/Family education, Self Care, Electrical stimulation, Cryotherapy, Vasopneumatic device, and Manual  therapy  PLAN FOR NEXT SESSION: Nustep.  Progress per TKA protocol.    [place]an[place] Lewis, PT 06/06/2023,  5:11 PM

## 2023-06-11 ENCOUNTER — Ambulatory Visit: Payer: No Typology Code available for payment source | Admitting: Physical Therapy

## 2023-06-11 DIAGNOSIS — G8929 Other chronic pain: Secondary | ICD-10-CM

## 2023-06-11 DIAGNOSIS — M25661 Stiffness of right knee, not elsewhere classified: Secondary | ICD-10-CM

## 2023-06-11 DIAGNOSIS — R600 Localized edema: Secondary | ICD-10-CM

## 2023-06-11 DIAGNOSIS — M25561 Pain in right knee: Secondary | ICD-10-CM | POA: Diagnosis not present

## 2023-06-11 NOTE — Therapy (Addendum)
OUTPATIENT PHYSICAL THERAPY LOWER EXTREMITY TREATMENT   Patient Name: Ryan Mckee MRN: [id number] DOB:1950/09/14, 73 y.o., male Today's Date: 06/11/2023  END OF SESSION:  PT End of Session - 06/11/23 1610     Visit Number 8    Number of Visits 15    Date for PT Re-Evaluation 07/12/23    Authorization Type FOTO    PT Start Time 0358    PT Stop Time 0440    PT Time Calculation (min) 42 min    Activity Tolerance Patient tolerated treatment well    Behavior During Therapy [facility] for tasks assessed/performed             Past Medical History:  Diagnosis Date   CAD (coronary artery disease) 2007   BMS to mid RCA and DES to mid LAD 2007   Essential hypertension    GERD (gastroesophageal reflux disease)    Mixed hyperlipidemia    Type 2 diabetes mellitus (HCC)    Past Surgical History:  Procedure Laterality Date   GALLBLADDER SURGERY     Renal stent intervention     TONSILLECTOMY     There are no problems to display for this patient.  REFERRING PROVIDER: Kenna Gilbert MD  REFERRING DIAG: Primary OA of right knee, right total knee replacement.  THERAPY DIAG:  Chronic pain of right knee  Stiffness of right knee, not elsewhere classified  Localized edema  Rationale for Evaluation and Treatment: Rehabilitation  ONSET DATE: 05/15/23 (surgery date).  SUBJECTIVE:   SUBJECTIVE STATEMENT: Pain at a 6 today.  PERTINENT HISTORY: DM.  PAIN:  Are you having pain? Yes: NPRS scale: 6/10 Pain location: Right knee. Pain description: As above. Aggravating factors: Movement, bending. Relieving factors: Rest and ice.    PRECAUTIONS: Other: No ultrasound.  PATIENT GOALS: Get out of pain.  OBJECTIVE:   PATIENT SURVEYS:  FOTO 60  EDEMA:  Circumferential: R knee 41.5 cm, L knee 36.4 cm.  PALPATION: Tenderness of R calf  LOWER EXTREMITY ROM:     Active  Right eval Right AROM 05/28/2023 Right AROM 06/04/23 Right A/PROM 06/11/23  Hip flexion      Hip extension       Hip abduction      Hip adduction      Hip internal rotation      Hip external rotation      Knee flexion 95 95  115/120  Knee extension 10 5 5    Ankle dorsiflexion      Ankle plantarflexion      Ankle inversion      Ankle eversion       (Blank rows = not tested)   LOWER EXTREMITY MMT: Patient able to perform a right antigravity SLR and SAQ.  GAIT: Safe gait with decreased step and stride length using a FWW.  TODAY'S TREATMENT:  DATE:                                   06/11/23 EXERCISE LOG  Exercise Repetitions and Resistance Comments  Nustep  L3 x 15 minutes; seat 9,8 7   Recumbent bike Level 1 x 5 minutes (seat 7).   Knee ext 10#  3 minutes.   Ham curls 30#  3 minutes            In supine:  LLLDS into right knee flexion and extension (12 minutes total). PATIENT EDUCATION:  Education details:  Person educated:  International aid/development worker:  Education comprehension:   HOME EXERCISE PROGRAM:   ASSESSMENT:  CLINICAL IMPRESSION:      The patient did a great job today.  He progressed to recumbent bike at seat 7 and was able to make forward revolutions.  Added knee extension and ham curl machine performed with excellent technique and without complaint.  Post-stretching his active right knee flexion was 115 degrees and passive to 120 degrees.  OBJECTIVE IMPAIRMENTS: Abnormal gait, decreased activity tolerance, decreased ROM, increased edema, and pain.   ACTIVITY LIMITATIONS: carrying, lifting, bending, and locomotion level  PARTICIPATION LIMITATIONS: meal prep, cleaning, and laundry  REHAB POTENTIAL: Excellent  CLINICAL DECISION MAKING: Stable/uncomplicated  EVALUATION COMPLEXITY: Low  GOALS:  SHORT TERM GOALS: Target date: 05/31/23  Ind with an initial HEP. Goal status: On-going  2.  Full active right knee extension.  Goal status: On-going  3.   Active right knee flexion to 105 degrees.  Goal status: On-going  LONG TERM GOALS: Target date: 07/12/23  Ind with an advanced HEP.  Goal status: On-going  2.  Active right knee flexion to 120 degrees+ so the patient can perform functional tasks and do so with pain not > 2-3/10.  Goal status: On-going  3.  Increase right hip and knee strength to a 5/5 to provide good stability for accomplishment of functional activities.  Goal status: On-going  4.  Walk a community distance with pain not > 2-3/10.  Goal status: On-going  5.  Perform ADL's with pain not > 3/10.  Goal status: On-going  PLAN:  PT FREQUENCY: per [facility] 15 visits approved.  PT DURATION: other: 7-8 weeks.  PLANNED INTERVENTIONS: Therapeutic exercises, Therapeutic activity, Neuromuscular re-education, Gait training, Patient/Family education, Self Care, Electrical stimulation, Cryotherapy, Vasopneumatic device, and Manual therapy  PLAN FOR NEXT SESSION: Progress with recumbent bike.  Hailley Byers, [place], PT 06/11/2023, 5:18 PM

## 2023-06-13 ENCOUNTER — Encounter: Payer: Self-pay | Admitting: Physical Therapy

## 2023-06-13 ENCOUNTER — Ambulatory Visit: Payer: No Typology Code available for payment source | Admitting: Physical Therapy

## 2023-06-13 DIAGNOSIS — G8929 Other chronic pain: Secondary | ICD-10-CM

## 2023-06-13 DIAGNOSIS — M25561 Pain in right knee: Secondary | ICD-10-CM | POA: Diagnosis not present

## 2023-06-13 DIAGNOSIS — M25661 Stiffness of right knee, not elsewhere classified: Secondary | ICD-10-CM

## 2023-06-13 DIAGNOSIS — R600 Localized edema: Secondary | ICD-10-CM

## 2023-06-13 NOTE — Therapy (Signed)
OUTPATIENT PHYSICAL THERAPY LOWER EXTREMITY TREATMENT   Patient Name: Ryan Mckee MRN: [id number] DOB:[birth year], 73 y.o., male Today's Date: 06/13/2023  END OF SESSION:  PT End of Session - 06/13/23 1601     Visit Number 9    Number of Visits 15    Date for PT Re-Evaluation 07/12/23    Authorization Type FOTO    PT Start Time 1600    PT Stop Time 1646    PT Time Calculation (min) 46 min    Activity Tolerance Patient tolerated treatment well    Behavior During Therapy [facility] for tasks assessed/performed            Past Medical History:  Diagnosis Date   CAD (coronary artery disease) 2007   BMS to mid RCA and DES to mid LAD 2007   Essential hypertension    GERD (gastroesophageal reflux disease)    Mixed hyperlipidemia    Type 2 diabetes mellitus (HCC)    Past Surgical History:  Procedure Laterality Date   GALLBLADDER SURGERY     Renal stent intervention     TONSILLECTOMY     There are no problems to display for this patient.  REFERRING PROVIDER: Kenna Gilbert MD  REFERRING DIAG: Primary OA of right knee, right total knee replacement.  THERAPY DIAG:  Chronic pain of right knee  Stiffness of right knee, not elsewhere classified  Localized edema  Rationale for Evaluation and Treatment: Rehabilitation  ONSET DATE: 05/15/23 (surgery date).  SUBJECTIVE:   SUBJECTIVE STATEMENT: Pain began several hours after last PT session with weights machine but went away. Only taking OTC Tylenol.  PERTINENT HISTORY: DM.  PAIN:  Are you having pain? Yes: NPRS scale: 2/10 Pain location: Right knee. Pain description: As above. Aggravating factors: Movement, bending. Relieving factors: Rest and ice.    PRECAUTIONS: Other: No ultrasound.  PATIENT GOALS: Get out of pain.  OBJECTIVE:   PATIENT SURVEYS:  FOTO 60  EDEMA:  Circumferential: R knee 41.5 cm, L knee 36.4 cm.  PALPATION: Tenderness of R calf  LOWER EXTREMITY ROM:     Active  Right eval Right  AROM 05/28/2023 Right AROM 06/04/23 Right A/PROM 06/11/23 R knee AROM 06/13/23  Hip flexion       Hip extension       Hip abduction       Hip adduction       Hip internal rotation       Hip external rotation       Knee flexion 95 95  115/120 118  Knee extension 10 5 5  4   Ankle dorsiflexion       Ankle plantarflexion       Ankle inversion       Ankle eversion        (Blank rows = not tested)   LOWER EXTREMITY MMT: Patient able to perform a right antigravity SLR and SAQ.  GAIT: Mild antalgic deviation/ less RLE WB with SPC  TODAY'S TREATMENT:  DATE:  06/13/23 EXERCISE LOG  Exercise Repetitions and Resistance Comments  Nustep  L3 x 10 minutes; seat 6   Recumbent bike L2, seat 7 x10 min   Rockerboard X2 min   Standing HS stretch 3x30 sec   Lunges 8" box x15 reps   LAQ 5# x20 reps 5 sec holds   Heel prop into ext X5 min   Supine hip flexor stretch 2x 1 min    PATIENT EDUCATION:  Education details:  Person educated:  International aid/development worker:  Education comprehension:   HOME EXERCISE PROGRAM:  ASSESSMENT:  CLINICAL IMPRESSION:    Patient presented in clinic with reports of mild R knee pain. Patient still unable to sleep for long duration in bed which is taller. Patient able to tolerate progression of exercises for ROM well. Machine weights were not completed today secondary to pain from last PT treatment. AROM of R knee improving with mild edema noted. Patient observed with and without SPC ambulating in clinic with only mild antalgic deviation. Patient states that he is holding his cane more than using it for stability at this time. Hip flexor stretching initiated to help with ROM as well as quad tightness reported.  OBJECTIVE IMPAIRMENTS: Abnormal gait, decreased activity tolerance, decreased ROM, increased edema, and pain.   ACTIVITY LIMITATIONS: carrying,  lifting, bending, and locomotion level  PARTICIPATION LIMITATIONS: meal prep, cleaning, and laundry  REHAB POTENTIAL: Excellent  CLINICAL DECISION MAKING: Stable/uncomplicated  EVALUATION COMPLEXITY: Low  GOALS:  SHORT TERM GOALS: Target date: 05/31/23  Ind with an initial HEP. Goal status: On-going  2.  Full active right knee extension.  Goal status: On-going  3.  Active right knee flexion to 105 degrees.  Goal status: MET  LONG TERM GOALS: Target date: 07/12/23  Ind with an advanced HEP.  Goal status: On-going  2.  Active right knee flexion to 120 degrees+ so the patient can perform functional tasks and do so with pain not > 2-3/10.  Goal status: On-going  3.  Increase right hip and knee strength to a 5/5 to provide good stability for accomplishment of functional activities.  Goal status: On-going  4.  Walk a community distance with pain not > 2-3/10.  Goal status: MET  5.  Perform ADL's with pain not > 3/10.  Goal status: MET  PLAN:  PT FREQUENCY: per [facility] 15 visits approved.  PT DURATION: other: 7-8 weeks.  PLANNED INTERVENTIONS: Therapeutic exercises, Therapeutic activity, Neuromuscular re-education, Gait training, Patient/Family education, Self Care, Electrical stimulation, Cryotherapy, Vasopneumatic device, and Manual therapy  PLAN FOR NEXT SESSION: Progress with recumbent bike. FOTO on 10th visit.  Marvell Fuller, PTA 06/13/2023, 5:01 PM

## 2023-06-18 ENCOUNTER — Ambulatory Visit: Payer: No Typology Code available for payment source | Admitting: Physical Therapy

## 2023-06-18 ENCOUNTER — Encounter: Payer: Self-pay | Admitting: Physical Therapy

## 2023-06-18 DIAGNOSIS — R600 Localized edema: Secondary | ICD-10-CM

## 2023-06-18 DIAGNOSIS — G8929 Other chronic pain: Secondary | ICD-10-CM

## 2023-06-18 DIAGNOSIS — M25661 Stiffness of right knee, not elsewhere classified: Secondary | ICD-10-CM

## 2023-06-18 DIAGNOSIS — M25561 Pain in right knee: Secondary | ICD-10-CM | POA: Diagnosis not present

## 2023-06-18 NOTE — Therapy (Signed)
OUTPATIENT PHYSICAL THERAPY LOWER EXTREMITY TREATMENT   Patient Name: Ryan Mckee MRN: [id number] DOB:1950-10-05, 73 y.o., male Today's Date: 06/18/2023  END OF SESSION:  PT End of Session - 06/18/23 1603     Visit Number 10    Number of Visits 15    Date for PT Re-Evaluation 07/12/23    Authorization Type FOTO    PT Start Time 1601    PT Stop Time 1647    PT Time Calculation (min) 46 min    Activity Tolerance Patient tolerated treatment well    Behavior During Therapy [facility] for tasks assessed/performed            Past Medical History:  Diagnosis Date   CAD (coronary artery disease) 2007   BMS to mid RCA and DES to mid LAD 2007   Essential hypertension    GERD (gastroesophageal reflux disease)    Mixed hyperlipidemia    Type 2 diabetes mellitus (HCC)    Past Surgical History:  Procedure Laterality Date   GALLBLADDER SURGERY     Renal stent intervention     TONSILLECTOMY     There are no problems to display for this patient.  REFERRING PROVIDER: Kenna Gilbert MD  REFERRING DIAG: Primary OA of right knee, right total knee replacement.  THERAPY DIAG:  Chronic pain of right knee  Stiffness of right knee, not elsewhere classified  Localized edema  Rationale for Evaluation and Treatment: Rehabilitation  ONSET DATE: 05/15/23 (surgery date).  SUBJECTIVE:   SUBJECTIVE STATEMENT: Reports that he got rid of his cane as he felt like he was using it to correct imbalance. Has been having more throbbing, ache, tingling sensation along medial and lateral knee.  PERTINENT HISTORY: DM.  PAIN:  Are you having pain? Yes: NPRS scale: Did not provided numerical score/10 Pain location: Right knee. Pain description: As above. Aggravating factors: Movement, bending. Relieving factors: Rest and ice.    PRECAUTIONS: Other: No ultrasound.  PATIENT GOALS: Get out of pain.  OBJECTIVE:   PATIENT SURVEYS:  FOTO 69  EDEMA:  Circumferential: R knee 41.5 cm, L knee 36.4  cm.  PALPATION: Increased tone of medial and lateral R HS distally.  LOWER EXTREMITY ROM:     Active  Right eval Right AROM 05/28/2023 Right AROM 06/04/23 Right A/PROM 06/11/23 R knee AROM 06/13/23  Hip flexion       Hip extension       Hip abduction       Hip adduction       Hip internal rotation       Hip external rotation       Knee flexion 95 95  115/120 118  Knee extension 10 5 5  4   Ankle dorsiflexion       Ankle plantarflexion       Ankle inversion       Ankle eversion        (Blank rows = not tested)   LOWER EXTREMITY MMT: Patient able to perform a right antigravity SLR and SAQ.  GAIT: WNL with no AD  TODAY'S TREATMENT:  DATE:  06/18/23 EXERCISE LOG  Exercise Repetitions and Resistance Comments  Nustep  L5 x 5 minutes; seat 7   Recumbent bike L3, seat 7 x10 min   Hip abduction  BLE x15 reps each   Mini squats  X10 reps fatigued  Lunges 8" box x15 reps   Forward heel dot 4" step x20 reps   Lateral heel dot  4" step x20 reps   LAQ 5# x20 reps 5 sec holds   Sit to stand 2" step under LLE x15 reps   Prone hang X4 min     PATIENT EDUCATION:  Education details: Prone hang, seated HS stretch  Person educated: Patient  Education method: Programmer, multimedia, demonstration Education comprehension: Patient verbalized understanding  HOME EXERCISE PROGRAM:  ASSESSMENT:  CLINICAL IMPRESSION:  Patient presented in clinic with reports of greater difficulty sleeping as sensation seems to be improving in R knee and having nerve related discomfort. Patient progressed for ROM and functional strengthening as patient fatigues quickly and lack of full R knee extension noted in stance. Patient instructed in prone hang completion at home if he has an appropriate surface as well as seated HS stretch due to increased tone of medial and lateral HS. Lateral R HS is where  patient indicates that a locking sensation occurs that dissipates after massage.  OBJECTIVE IMPAIRMENTS: Abnormal gait, decreased activity tolerance, decreased ROM, increased edema, and pain.   ACTIVITY LIMITATIONS: carrying, lifting, bending, and locomotion level  PARTICIPATION LIMITATIONS: meal prep, cleaning, and laundry  REHAB POTENTIAL: Excellent  CLINICAL DECISION MAKING: Stable/uncomplicated  EVALUATION COMPLEXITY: Low  GOALS:  SHORT TERM GOALS: Target date: 05/31/23  Ind with an initial HEP. Goal status: On-going  2.  Full active right knee extension.  Goal status: On-going  3.  Active right knee flexion to 105 degrees.  Goal status: MET  LONG TERM GOALS: Target date: 07/12/23  Ind with an advanced HEP.  Goal status: On-going  2.  Active right knee flexion to 120 degrees+ so the patient can perform functional tasks and do so with pain not > 2-3/10.  Goal status: On-going  3.  Increase right hip and knee strength to a 5/5 to provide good stability for accomplishment of functional activities.  Goal status: On-going  4.  Walk a community distance with pain not > 2-3/10.  Goal status: MET  5.  Perform ADL's with pain not > 3/10.  Goal status: MET  PLAN:  PT FREQUENCY: per [facility] 15 visits approved.  PT DURATION: other: 7-8 weeks.  PLANNED INTERVENTIONS: Therapeutic exercises, Therapeutic activity, Neuromuscular re-education, Gait training, Patient/Family education, Self Care, Electrical stimulation, Cryotherapy, Vasopneumatic device, and Manual therapy  PLAN FOR NEXT SESSION: Progress with knee extension and functional strengthening.  Marvell Fuller, PTA 06/18/2023, 5:03 PM   Progress Note Reporting Period 05/17/23 to 06/18/23.  See note below for Objective Data and Assessment of Progress/Goals. Very good progress since beginning PT and progression toward goals.    [place] MPT

## 2023-06-20 ENCOUNTER — Ambulatory Visit: Payer: No Typology Code available for payment source | Admitting: Physical Therapy

## 2023-06-20 ENCOUNTER — Encounter: Payer: Self-pay | Admitting: Physical Therapy

## 2023-06-20 DIAGNOSIS — M25561 Pain in right knee: Secondary | ICD-10-CM | POA: Diagnosis not present

## 2023-06-20 DIAGNOSIS — M25661 Stiffness of right knee, not elsewhere classified: Secondary | ICD-10-CM

## 2023-06-20 DIAGNOSIS — G8929 Other chronic pain: Secondary | ICD-10-CM

## 2023-06-20 DIAGNOSIS — R600 Localized edema: Secondary | ICD-10-CM

## 2023-06-20 NOTE — Therapy (Signed)
OUTPATIENT PHYSICAL THERAPY LOWER EXTREMITY TREATMENT   Patient Name: Ryan Mckee MRN: [id number] DOB:[birth year], 73 y.o., male Today's Date: 06/20/2023  END OF SESSION:  PT End of Session - 06/20/23 1603     Visit Number 11    Number of Visits 15    Date for PT Re-Evaluation 07/12/23    Authorization Type FOTO    PT Start Time 1602    PT Stop Time 1645    PT Time Calculation (min) 43 min    Activity Tolerance Patient tolerated treatment well    Behavior During Therapy [facility] for tasks assessed/performed            Past Medical History:  Diagnosis Date   CAD (coronary artery disease) 2007   BMS to mid RCA and DES to mid LAD 2007   Essential hypertension    GERD (gastroesophageal reflux disease)    Mixed hyperlipidemia    Type 2 diabetes mellitus (HCC)    Past Surgical History:  Procedure Laterality Date   GALLBLADDER SURGERY     Renal stent intervention     TONSILLECTOMY     There are no problems to display for this patient.  REFERRING PROVIDER: Kenna Gilbert MD  REFERRING DIAG: Primary OA of right knee, right total knee replacement.  THERAPY DIAG:  Chronic pain of right knee  Stiffness of right knee, not elsewhere classified  Localized edema  Rationale for Evaluation and Treatment: Rehabilitation  ONSET DATE: 05/15/23 (surgery date).  SUBJECTIVE:   SUBJECTIVE STATEMENT: Reports that his step daughter, who is a massage therapist, massaged his HS and he felt good for a few hours but was very sore even until today. States that his step daughter said his HS was very tight.   PERTINENT HISTORY: DM.  PAIN:  Are you having pain? Yes: NPRS scale: 3/10 Pain location: Right knee. Pain description: Sore Aggravating factors: Movement, bending. Relieving factors: Rest and ice.    PRECAUTIONS: Other: No ultrasound.  PATIENT GOALS: Get out of pain.  OBJECTIVE:   PATIENT SURVEYS:  FOTO 69  EDEMA:  Circumferential: R knee 41.5 cm, L knee 36.4  cm.  PALPATION: Increased tone of medial and lateral R HS distally.  LOWER EXTREMITY ROM:     Active  Right eval Right AROM 05/28/2023 Right AROM 06/04/23 Right A/PROM 06/11/23 R knee AROM 06/13/23 Right  AROM  06/20/23  Hip flexion        Hip extension        Hip abduction        Hip adduction        Hip internal rotation        Hip external rotation        Knee flexion 95 95  115/120 118 120  Knee extension 10 5 5  4 4   Ankle dorsiflexion        Ankle plantarflexion        Ankle inversion        Ankle eversion         (Blank rows = not tested)   LOWER EXTREMITY MMT: Patient able to perform a right antigravity SLR and SAQ.  GAIT: WNL with no AD  TODAY'S TREATMENT:  DATE:  06/20/23 EXERCISE LOG  Exercise Repetitions and Resistance Comments  Nustep  L5 x 10 minutes; seat 7   Recumbent bike L1, seat 6 x10 min   Lunges 8" box x15 reps   Forward heel dot 4" step x20 reps   Rockerboard For stretch x4 min   Lunges  BLE 8" step x20 reps each   Seated HS stretch 4 x20 sec        PATIENT EDUCATION:  Education details: Prone hang, seated HS stretch  Person educated: Patient  Education method: Explanation, demonstration Education comprehension: Patient verbalized understanding  HOME EXERCISE PROGRAM:  ASSESSMENT:  CLINICAL IMPRESSION:  Patient presented in clinic with reports of soreness after massage to HS. Primary focus of today's treatment being stretching for calves, HS and quads. Patient states that he has done the prone hangs as educated in previous treatment, at home. Patient states that he has used heat to posterior RLE and has felt relief. Patient was instructed to use for no longer than 20 minutes but to continue to help with muscle tightness.  OBJECTIVE IMPAIRMENTS: Abnormal gait, decreased activity tolerance, decreased ROM, increased edema,  and pain.   ACTIVITY LIMITATIONS: carrying, lifting, bending, and locomotion level  PARTICIPATION LIMITATIONS: meal prep, cleaning, and laundry  REHAB POTENTIAL: Excellent  CLINICAL DECISION MAKING: Stable/uncomplicated  EVALUATION COMPLEXITY: Low  GOALS:  SHORT TERM GOALS: Target date: 05/31/23  Ind with an initial HEP. Goal status: On-going  2.  Full active right knee extension.  Goal status: On-going  3.  Active right knee flexion to 105 degrees.  Goal status: MET  LONG TERM GOALS: Target date: 07/12/23  Ind with an advanced HEP.  Goal status: MET/ prone hang  2.  Active right knee flexion to 120 degrees+ so the patient can perform functional tasks and do so with pain not > 2-3/10.  Goal status: MET  3.  Increase right hip and knee strength to a 5/5 to provide good stability for accomplishment of functional activities.  Goal status: On-going  4.  Walk a community distance with pain not > 2-3/10.  Goal status: MET  5.  Perform ADL's with pain not > 3/10.  Goal status: MET  PLAN:  [facility]QUENC[facility]: per [facility] visits approved.  PT DURATION: other: 7-8 weeks.  PLANNED INTERVENTIONS: Therapeutic exercises, Therapeutic activity, Neuromuscular re-education, Gait training, Patient/Family education, Self Care, Electrical stimulation, Cryotherapy, Vasopneumatic device, and Manual therapy  PLAN FOR NEXT SESSION: Progress with knee extension and functional strengthening.  Marvell Fuller, PTA 06/20/2023, 4:48 PM

## 2023-06-25 ENCOUNTER — Encounter: Payer: Self-pay | Admitting: Physical Therapy

## 2023-06-25 ENCOUNTER — Ambulatory Visit: Payer: No Typology Code available for payment source | Admitting: Physical Therapy

## 2023-06-25 DIAGNOSIS — M25561 Pain in right knee: Secondary | ICD-10-CM | POA: Diagnosis not present

## 2023-06-25 DIAGNOSIS — M25661 Stiffness of right knee, not elsewhere classified: Secondary | ICD-10-CM

## 2023-06-25 DIAGNOSIS — R600 Localized edema: Secondary | ICD-10-CM

## 2023-06-25 DIAGNOSIS — G8929 Other chronic pain: Secondary | ICD-10-CM

## 2023-06-25 NOTE — Therapy (Signed)
OUTPATIENT PHYSICAL THERAPY LOWER EXTREMITY TREATMENT   Patient Name: Ryan Mckee MRN: [id number] DOB:01-13-1950, 73 y.o., male Today's Date: 06/25/2023  END OF SESSION:  PT End of Session - 06/25/23 1646     Visit Number 12    Number of Visits 15    Date for PT Re-Evaluation 07/12/23    Authorization Type FOTO    PT Start Time 1645    PT Stop Time 1732    PT Time Calculation (min) 47 min    Activity Tolerance Patient tolerated treatment well    Behavior During Therapy [facility] for tasks assessed/performed            Past Medical History:  Diagnosis Date   CAD (coronary artery disease) 2007   BMS to mid RCA and DES to mid LAD 2007   Essential hypertension    GERD (gastroesophageal reflux disease)    Mixed hyperlipidemia    Type 2 diabetes mellitus (HCC)    Past Surgical History:  Procedure Laterality Date   GALLBLADDER SURGERY     Renal stent intervention     TONSILLECTOMY     There are no problems to display for this patient.  REFERRING PROVIDER: Kenna Gilbert MD  REFERRING DIAG: Primary OA of right knee, right total knee replacement.  THERAPY DIAG:  Chronic pain of right knee  Stiffness of right knee, not elsewhere classified  Localized edema  Rationale for Evaluation and Treatment: Rehabilitation  ONSET DATE: 05/15/23 (surgery date).  SUBJECTIVE:   SUBJECTIVE STATEMENT: Still has popping in distal R lateral HS and numbness along lateral knee and into lateral calf. Has MD appointment tomorrow.  PERTINENT HISTORY: DM.  PAIN:  Are you having pain? Yes: NPRS scale: 2/10 Pain location: Right knee. Pain description: Sore Aggravating factors: Movement, bending. Relieving factors: Rest and ice.    PRECAUTIONS: Other: No ultrasound.  PATIENT GOALS: Get out of pain.  OBJECTIVE:   PATIENT SURVEYS:  FOTO 69  EDEMA:  Circumferential: R knee 41.5 cm, L knee 36.4 cm.  PALPATION: Increased tone of medial and lateral R HS distally.  LOWER EXTREMITY  ROM:     Active  Right eval Right AROM 05/28/2023 Right AROM 06/04/23 Right A/PROM 06/11/23 R knee AROM 06/13/23 Right  AROM  06/20/23  Hip flexion        Hip extension        Hip abduction        Hip adduction        Hip internal rotation        Hip external rotation        Knee flexion 95 95  115/120 118 120  Knee extension 10 5 5  4 4   Ankle dorsiflexion        Ankle plantarflexion        Ankle inversion        Ankle eversion         (Blank rows = not tested)   LOWER EXTREMITY MMT:    MMT Right 06/25/23  Hip flexion   Hip extension   Hip abduction   Hip adduction   Hip internal rotation   Hip external rotation   Knee flexion 4/5; pain  Knee extension 5/5  Ankle dorsiflexion   Ankle plantarflexion   Ankle inversion   Ankle eversion    (Blank rows = not tested)   GAIT: WNL with no AD  TODAY'S TREATMENT:  DATE:  06/25/23 EXERCISE LOG  Exercise Repetitions and Resistance Comments  Nustep  L5 x 5 minutes; seat 7   Recumbent bike L1, seat 6 x10 min   Knee extension 10# x30 reps   Knee flexion 20# x30 reps   Rockerboard For stretch x4 min   Seated HS stretch 4 x20 sec        Manual Therapy Myofascial Release: R lateral HS, IASTW to reduce tone and popping sensation in prone hang    PATIENT EDUCATION:  Education details: Prone hang, seated HS stretch  Person educated: Patient  Education method: Explanation, demonstration Education comprehension: Patient verbalized understanding  HOME EXERCISE PROGRAM:  ASSESSMENT:  CLINICAL IMPRESSION:  Patient presented in clinic with reports of continued popping of the R lateral HS intermittently which is uncomfortable and causes a minor increase in pain. Patient ambulating without an AD with mild knee flexion noted in stance. Mild edema also notable surrounding R knee. Increased tone palpable  throughout patient's HS but especially lateral HS. Patient has had massage and other manual therapy techniques to reduce tone but still present. Patient able to achieve most goals although limited with knee extension as well as HS MMT due to pain.  OBJECTIVE IMPAIRMENTS: Abnormal gait, decreased activity tolerance, decreased ROM, increased edema, and pain.   ACTIVITY LIMITATIONS: carrying, lifting, bending, and locomotion level  PARTICIPATION LIMITATIONS: meal prep, cleaning, and laundry  REHAB POTENTIAL: Excellent  CLINICAL DECISION MAKING: Stable/uncomplicated  EVALUATION COMPLEXITY: Low  GOALS:  SHORT TERM GOALS: Target date: 05/31/23  Ind with an initial HEP. Goal status: MET/ verbal HEP for prone hang  2.  Full active right knee extension.  Goal status: NOT MET  3.  Active right knee flexion to 105 degrees.  Goal status: MET  LONG TERM GOALS: Target date: 07/12/23  Ind with an advanced HEP.  Goal status: MET/ prone hang  2.  Active right knee flexion to 120 degrees+ so the patient can perform functional tasks and do so with pain not > 2-3/10.  Goal status: MET  3.  Increase right hip and knee strength to a 5/5 to provide good stability for accomplishment of functional activities.  Goal status: PARTIALLY MET  4.  Walk a community distance with pain not > 2-3/10.  Goal status: MET  5.  Perform ADL's with pain not > 3/10.  Goal status: MET  PLAN:  PT FREQUENCY: per [facility] 15 visits approved.  PT DURATION: other: 7-8 weeks.  PLANNED INTERVENTIONS: Therapeutic exercises, Therapeutic activity, Neuromuscular re-education, Gait training, Patient/Family education, Self Care, Electrical stimulation, Cryotherapy, Vasopneumatic device, and Manual therapy  PLAN FOR NEXT SESSION: Continue or DC per MD discretion.  Marvell Fuller, PTA 06/25/2023, 5:44 PM

## 2023-06-27 ENCOUNTER — Ambulatory Visit: Payer: No Typology Code available for payment source | Admitting: Physical Therapy

## 2023-06-27 DIAGNOSIS — M25661 Stiffness of right knee, not elsewhere classified: Secondary | ICD-10-CM

## 2023-06-27 DIAGNOSIS — M25561 Pain in right knee: Secondary | ICD-10-CM | POA: Diagnosis not present

## 2023-06-27 DIAGNOSIS — R600 Localized edema: Secondary | ICD-10-CM

## 2023-06-27 DIAGNOSIS — G8929 Other chronic pain: Secondary | ICD-10-CM

## 2023-06-27 NOTE — Therapy (Signed)
OUTPATIENT PHYSICAL THERAPY LOWER EXTREMITY TREATMENT   Patient Name: Ryan Mckee MRN: [id number] DOB:1950/02/23, 73 y.o., male Today's Date: 06/27/2023  END OF SESSION:  PT End of Session - 06/27/23 1707     Visit Number 13    Number of Visits 15    Date for PT Re-Evaluation 07/12/23    Authorization Type FOTO    PT Start Time 0401    PT Stop Time 0439    PT Time Calculation (min) 38 min    Activity Tolerance Patient tolerated treatment well    Behavior During Therapy [facility] for tasks assessed/performed            Past Medical History:  Diagnosis Date   CAD (coronary artery disease) 2007   BMS to mid RCA and DES to mid LAD 2007   Essential hypertension    GERD (gastroesophageal reflux disease)    Mixed hyperlipidemia    Type 2 diabetes mellitus (HCC)    Past Surgical History:  Procedure Laterality Date   GALLBLADDER SURGERY     Renal stent intervention     TONSILLECTOMY     There are no problems to display for this patient.  REFERRING PROVIDER: Kenna Gilbert MD  REFERRING DIAG: Primary OA of right knee, right total knee replacement.  THERAPY DIAG:  Chronic pain of right knee  Stiffness of right knee, not elsewhere classified  Localized edema  Rationale for Evaluation and Treatment: Rehabilitation  ONSET DATE: 05/15/23 (surgery date).  SUBJECTIVE:   SUBJECTIVE STATEMENT: MD thinks popping is due to swelling and tendon going over bone. PERTINENT HISTORY: DM.  PAIN:  Are you having pain? Yes: NPRS scale: 2/10 Pain location: Right knee. Pain description: Sore Aggravating factors: Movement, bending. Relieving factors: Rest and ice.    PRECAUTIONS: Other: No ultrasound.  PATIENT GOALS: Get out of pain.  OBJECTIVE:   PATIENT SURVEYS:  FOTO 69  EDEMA:  Circumferential: R knee 41.5 cm, L knee 36.4 cm.  PALPATION: Increased tone of medial and lateral R HS distally.  LOWER EXTREMITY ROM:     Active  Right eval Right AROM 05/28/2023 Right  AROM 06/04/23 Right A/PROM 06/11/23 R knee AROM 06/13/23 Right  AROM  06/20/23  Hip flexion        Hip extension        Hip abduction        Hip adduction        Hip internal rotation        Hip external rotation        Knee flexion 95 95  115/120 118 120  Knee extension 10 5 5  4 4   Ankle dorsiflexion        Ankle plantarflexion        Ankle inversion        Ankle eversion         (Blank rows = not tested)   LOWER EXTREMITY MMT:    MMT Right 06/25/23  Hip flexion   Hip extension   Hip abduction   Hip adduction   Hip internal rotation   Hip external rotation   Knee flexion 4/5; pain  Knee extension 5/5  Ankle dorsiflexion   Ankle plantarflexion   Ankle inversion   Ankle eversion    (Blank rows = not tested)   GAIT: WNL with no AD  TODAY'S TREATMENT:  DATE:  06/25/23 EXERCISE LOG  Exercise Repetitions and Resistance Comments  Nustep  L4 x 15 minutes.                           STW/M including gentle IASTM x 15 minutes to distal ITB and distal/lateral hamstring.  PATIENT EDUCATION:  Education details: Prone hang, seated HS stretch  Person educated: Patient  Education method: Explanation, demonstration Education comprehension: Patient verbalized understanding  HOME EXERCISE PROGRAM:  ASSESSMENT:  CLINICAL IMPRESSION:  Patient did well with PT today.  Held on recumbent bike and weight machines today.  MD states popping is likely due to remaining swelling and tendon going over bone.  Two visits remaining.  OBJECTIVE IMPAIRMENTS: Abnormal gait, decreased activity tolerance, decreased ROM, increased edema, and pain.   ACTIVITY LIMITATIONS: carrying, lifting, bending, and locomotion level  PARTICIPATION LIMITATIONS: meal prep, cleaning, and laundry  REHAB POTENTIAL: Excellent  CLINICAL DECISION MAKING:  Stable/uncomplicated  EVALUATION COMPLEXITY: Low  GOALS:  SHORT TERM GOALS: Target date: 05/31/23  Ind with an initial HEP. Goal status: MET/ verbal HEP for prone hang  2.  Full active right knee extension.  Goal status: NOT MET  3.  Active right knee flexion to 105 degrees.  Goal status: MET  LONG TERM GOALS: Target date: 07/12/23  Ind with an advanced HEP.  Goal status: MET/ prone hang  2.  Active right knee flexion to 120 degrees+ so the patient can perform functional tasks and do so with pain not > 2-3/10.  Goal status: MET  3.  Increase right hip and knee strength to a 5/5 to provide good stability for accomplishment of functional activities.  Goal status: PARTIALLY MET  4.  Walk a community distance with pain not > 2-3/10.  Goal status: MET  5.  Perform ADL's with pain not > 3/10.  Goal status: MET  PLAN:  PT FREQUENCY: per [facility] 15 visits approved.  PT DURATION: other: 7-8 weeks.  PLANNED INTERVENTIONS: Therapeutic exercises, Therapeutic activity, Neuromuscular re-education, Gait training, Patient/Family education, Self Care, Electrical stimulation, Cryotherapy, Vasopneumatic device, and Manual therapy  PLAN FOR NEXT SESSION: Continue or DC per MD discretion.  Kento Gossman, [place], PT 06/27/2023, 5:18 PM

## 2023-07-02 ENCOUNTER — Encounter: Payer: Self-pay | Admitting: Physical Therapy

## 2023-07-02 ENCOUNTER — Ambulatory Visit: Payer: No Typology Code available for payment source | Attending: Orthopaedic Surgery | Admitting: Physical Therapy

## 2023-07-02 DIAGNOSIS — R600 Localized edema: Secondary | ICD-10-CM | POA: Insufficient documentation

## 2023-07-02 DIAGNOSIS — M25561 Pain in right knee: Secondary | ICD-10-CM | POA: Insufficient documentation

## 2023-07-02 DIAGNOSIS — G8929 Other chronic pain: Secondary | ICD-10-CM | POA: Insufficient documentation

## 2023-07-02 DIAGNOSIS — M25661 Stiffness of right knee, not elsewhere classified: Secondary | ICD-10-CM | POA: Diagnosis present

## 2023-07-02 NOTE — Therapy (Signed)
OUTPATIENT PHYSICAL THERAPY LOWER EXTREMITY TREATMENT   Patient Name: Ryan Mckee MRN: [id number] DOB:Nov 01, 1950, 73 y.o., male Today's Date: 07/02/2023  END OF SESSION:  PT End of Session - 07/02/23 1635     Visit Number 14    Number of Visits 15    Date for PT Re-Evaluation 07/12/23    Authorization Type FOTO    PT Start Time 0400    PT Stop Time 0433    PT Time Calculation (min) 33 min    Activity Tolerance Patient tolerated treatment well    Behavior During Therapy [facility] for tasks assessed/performed            Past Medical History:  Diagnosis Date   CAD (coronary artery disease) 2007   BMS to mid RCA and DES to mid LAD 2007   Essential hypertension    GERD (gastroesophageal reflux disease)    Mixed hyperlipidemia    Type 2 diabetes mellitus (HCC)    Past Surgical History:  Procedure Laterality Date   GALLBLADDER SURGERY     Renal stent intervention     TONSILLECTOMY     There are no problems to display for this patient.  REFERRING PROVIDER: Kenna Gilbert MD  REFERRING DIAG: Primary OA of right knee, right total knee replacement.  THERAPY DIAG:  Chronic pain of right knee  Stiffness of right knee, not elsewhere classified  Localized edema  Rationale for Evaluation and Treatment: Rehabilitation  ONSET DATE: 05/15/23 (surgery date).  SUBJECTIVE:   SUBJECTIVE STATEMENT: Still getting popping.  MD said X-ray looked good.  PERTINENT HISTORY: DM.  PAIN:  Are you having pain? Yes: NPRS scale: 2/10 Pain location: Right knee. Pain description: Sore Aggravating factors: Movement, bending. Relieving factors: Rest and ice.    PRECAUTIONS: Other: No ultrasound.  PATIENT GOALS: Get out of pain.  OBJECTIVE:   PATIENT SURVEYS:  FOTO 69  EDEMA:  Circumferential: R knee 41.5 cm, L knee 36.4 cm.  PALPATION: Increased tone of medial and lateral R HS distally.  LOWER EXTREMITY ROM:     Active  Right eval Right AROM 05/28/2023 Right AROM 06/04/23  Right A/PROM 06/11/23 R knee AROM 06/13/23 Right  AROM  06/20/23  Hip flexion        Hip extension        Hip abduction        Hip adduction        Hip internal rotation        Hip external rotation        Knee flexion 95 95  115/120 118 120  Knee extension 10 5 5  4 4   Ankle dorsiflexion        Ankle plantarflexion        Ankle inversion        Ankle eversion         (Blank rows = not tested)   LOWER EXTREMITY MMT:    MMT Right 06/25/23  Hip flexion   Hip extension   Hip abduction   Hip adduction   Hip internal rotation   Hip external rotation   Knee flexion 4/5; pain  Knee extension 5/5  Ankle dorsiflexion   Ankle plantarflexion   Ankle inversion   Ankle eversion    (Blank rows = not tested)   GAIT: WNL with no AD  TODAY'S TREATMENT:  DATE:  06/25/23 EXERCISE LOG  Exercise Repetitions and Resistance Comments  Nustep  L4 x 15 minutes.                           STW/M including gentle IASTM x 15 minutes to distal ITB and distal/lateral hamstring and gentle PROM into right knee flexion and extension.  PATIENT EDUCATION:  Education details: Prone hang, seated HS stretch  Person educated: Patient  Education method: Explanation, demonstration Education comprehension: Patient verbalized understanding  HOME EXERCISE PROGRAM:  ASSESSMENT:  CLINICAL IMPRESSION:  Patient continues to reports popping and observed over right distal/lateral hamstring region today.  He states he is trying to walk a mile.  He did well with treatment today.  Patient realizes the importance to his being compliant to his HEP.    OBJECTIVE IMPAIRMENTS: Abnormal gait, decreased activity tolerance, decreased ROM, increased edema, and pain.   ACTIVITY LIMITATIONS: carrying, lifting, bending, and locomotion level  PARTICIPATION LIMITATIONS: meal prep, cleaning, and  laundry  REHAB POTENTIAL: Excellent  CLINICAL DECISION MAKING: Stable/uncomplicated  EVALUATION COMPLEXITY: Low  GOALS:  SHORT TERM GOALS: Target date: 05/31/23  Ind with an initial HEP. Goal status: MET/ verbal HEP for prone hang  2.  Full active right knee extension.  Goal status: NOT MET  3.  Active right knee flexion to 105 degrees.  Goal status: MET  LONG TERM GOALS: Target date: 07/12/23  Ind with an advanced HEP.  Goal status: MET/ prone hang  2.  Active right knee flexion to 120 degrees+ so the patient can perform functional tasks and do so with pain not > 2-3/10.  Goal status: MET  3.  Increase right hip and knee strength to a 5/5 to provide good stability for accomplishment of functional activities.  Goal status: PARTIALLY MET  4.  Walk a community distance with pain not > 2-3/10.  Goal status: MET  5.  Perform ADL's with pain not > 3/10.  Goal status: MET  PLAN:  PT FREQUENCY: per [facility] 15 visits approved.  PT DURATION: other: 7-8 weeks.  PLANNED INTERVENTIONS: Therapeutic exercises, Therapeutic activity, Neuromuscular re-education, Gait training, Patient/Family education, Self Care, Electrical stimulation, Cryotherapy, Vasopneumatic device, and Manual therapy  PLAN FOR NEXT SESSION: Continue or DC per MD discretion.  Laymond Postle, [place], PT 07/02/2023, 4:40 PM

## 2023-07-09 ENCOUNTER — Ambulatory Visit: Payer: No Typology Code available for payment source | Admitting: Physical Therapy

## 2023-07-09 ENCOUNTER — Encounter: Payer: Self-pay | Admitting: Physical Therapy

## 2023-07-09 DIAGNOSIS — R600 Localized edema: Secondary | ICD-10-CM

## 2023-07-09 DIAGNOSIS — G8929 Other chronic pain: Secondary | ICD-10-CM

## 2023-07-09 DIAGNOSIS — Z1329 Encounter for screening for other suspected endocrine disorder: Secondary | ICD-10-CM | POA: Diagnosis not present

## 2023-07-09 DIAGNOSIS — E1169 Type 2 diabetes mellitus with other specified complication: Secondary | ICD-10-CM | POA: Diagnosis not present

## 2023-07-09 DIAGNOSIS — Z125 Encounter for screening for malignant neoplasm of prostate: Secondary | ICD-10-CM | POA: Diagnosis not present

## 2023-07-09 DIAGNOSIS — E7849 Other hyperlipidemia: Secondary | ICD-10-CM | POA: Diagnosis not present

## 2023-07-09 DIAGNOSIS — E1122 Type 2 diabetes mellitus with diabetic chronic kidney disease: Secondary | ICD-10-CM | POA: Diagnosis not present

## 2023-07-09 DIAGNOSIS — I10 Essential (primary) hypertension: Secondary | ICD-10-CM | POA: Diagnosis not present

## 2023-07-09 DIAGNOSIS — M25661 Stiffness of right knee, not elsewhere classified: Secondary | ICD-10-CM

## 2023-07-09 DIAGNOSIS — E785 Hyperlipidemia, unspecified: Secondary | ICD-10-CM | POA: Diagnosis not present

## 2023-07-09 DIAGNOSIS — Z0001 Encounter for general adult medical examination with abnormal findings: Secondary | ICD-10-CM | POA: Diagnosis not present

## 2023-07-09 DIAGNOSIS — M25561 Pain in right knee: Secondary | ICD-10-CM | POA: Diagnosis not present

## 2023-07-09 NOTE — Therapy (Addendum)
OUTPATIENT PHYSICAL THERAPY LOWER EXTREMITY TREATMENT   Patient Name: Ryan Mckee MRN: [id number] DOB:1950-11-02, 73 y.o., male Today's Date: 07/09/2023  END OF SESSION:  PT End of Session - 07/09/23 1617     Visit Number 15    Number of Visits 15    Date for PT Re-Evaluation 07/12/23    Authorization Type FOTO    PT Start Time 1602    PT Stop Time 1645    PT Time Calculation (min) 43 min    Activity Tolerance Patient tolerated treatment well    Behavior During Therapy [facility] for tasks assessed/performed            Past Medical History:  Diagnosis Date   CAD (coronary artery disease) 2007   BMS to mid RCA and DES to mid LAD 2007   Essential hypertension    GERD (gastroesophageal reflux disease)    Mixed hyperlipidemia    Type 2 diabetes mellitus (HCC)    Past Surgical History:  Procedure Laterality Date   GALLBLADDER SURGERY     Renal stent intervention     TONSILLECTOMY     There are no problems to display for this patient.  REFERRING PROVIDER: Kenna Gilbert MD  REFERRING DIAG: Primary OA of right knee, right total knee replacement.  THERAPY DIAG:  Chronic pain of right knee  Stiffness of right knee, not elsewhere classified  Localized edema  Rationale for Evaluation and Treatment: Rehabilitation  ONSET DATE: 05/15/23 (surgery date).  SUBJECTIVE:   SUBJECTIVE STATEMENT: Still getting popping but able to walk around 1/2 mile.  PERTINENT HISTORY: DM.  PAIN:  Are you having pain? Yes: NPRS scale: 1-2/10 Pain location: Right knee. Pain description: Sore Aggravating factors: Movement, bending. Relieving factors: Rest and ice.    PRECAUTIONS: Other: No ultrasound.  PATIENT GOALS: Get out of pain.  OBJECTIVE:   PATIENT SURVEYS:  FOTO 74  EDEMA:  Circumferential: R knee 41.5 cm, L knee 36.4 cm.  PALPATION: Increased tone of  lateral R HS and ITB distally.  LOWER EXTREMITY ROM:     Active  Right eval Right AROM 05/28/2023 Right  AROM 06/04/23 Right A/PROM 06/11/23 R knee AROM 06/13/23 Right  AROM  06/20/23 Right AROM 07/09/23  Hip flexion         Hip extension         Hip abduction         Hip adduction         Hip internal rotation         Hip external rotation         Knee flexion 95 95  115/120 118 120 120  Knee extension 10 5 5  4 4 7   Ankle dorsiflexion         Ankle plantarflexion         Ankle inversion         Ankle eversion          (Blank rows = not tested)   LOWER EXTREMITY MMT:    MMT Right 06/25/23  Hip flexion   Hip extension   Hip abduction   Hip adduction   Hip internal rotation   Hip external rotation   Knee flexion 4/5; pain  Knee extension 5/5  Ankle dorsiflexion   Ankle plantarflexion   Ankle inversion   Ankle eversion    (Blank rows = not tested)   GAIT: WNL with no AD  TODAY'S TREATMENT:  DATE:  06/25/23 EXERCISE LOG  Exercise Repetitions and Resistance Comments  Nustep  L4, seat 8 x 13 min   Bike L1, seat 7 x11 min   Slant board X2 min   L forward lunge For R knee extension/stretch x20 reps   Back weightshift For R knee extension x20 reps   Seated HS stretch 3x30 sec        Manual Therapy Soft Tissue Mobilization: R lateral HS, ITB, to reduce tone    PATIENT EDUCATION:  Education details: Prone hang, seated HS stretch  Person educated: Patient  Education method: Explanation, demonstration Education comprehension: Patient verbalized understanding  HOME EXERCISE PROGRAM:  ASSESSMENT:  CLINICAL IMPRESSION:  Patient presented in clinic with reports of continued popping of R knee in lateral HS. Patient observed with minimal R knee flexion in stance. Patient able to achieve all goals set at evaluation except full R knee extension. Patient considering light running as he used to run. Patient was advised to listen to his body and avoid if pain  occurs and always use ice if needed.  OBJECTIVE IMPAIRMENTS: Abnormal gait, decreased activity tolerance, decreased ROM, increased edema, and pain.   ACTIVITY LIMITATIONS: carrying, lifting, bending, and locomotion level  PARTICIPATION LIMITATIONS: meal prep, cleaning, and laundry  REHAB POTENTIAL: Excellent  CLINICAL DECISION MAKING: Stable/uncomplicated  EVALUATION COMPLEXITY: Low  GOALS:  SHORT TERM GOALS: Target date: 05/31/23  Ind with an initial HEP. Goal status: MET/ verbal HEP for prone hang  2.  Full active right knee extension.  Goal status: NOT MET  3.  Active right knee flexion to 105 degrees.  Goal status: MET  LONG TERM GOALS: Target date: 07/12/23  Ind with an advanced HEP.  Goal status: MET/ prone hang  2.  Active right knee flexion to 120 degrees+ so the patient can perform functional tasks and do so with pain not > 2-3/10.  Goal status: MET  3.  Increase right hip and knee strength to a 5/5 to provide good stability for accomplishment of functional activities.  Goal status: PARTIALLY MET  4.  Walk a community distance with pain not > 2-3/10.  Goal status: MET  5.  Perform ADL's with pain not > 3/10.  Goal status: MET  PLAN:  PT FREQUENCY: per [facility] 15 visits approved.  PT DURATION: other: 7-8 weeks.  PLANNED INTERVENTIONS: Therapeutic exercises, Therapeutic activity, Neuromuscular re-education, Gait training, Patient/Family education, Self Care, Electrical stimulation, Cryotherapy, Vasopneumatic device, and Manual therapy  PLAN FOR NEXT SESSION: DC  Marvell Fuller, PTA 07/09/2023, 5:46 PM   PHYSICAL THERAPY DISCHARGE SUMMARY  Visits from Start of Care: 15.  Current functional level related to goals / functional outcomes: See above.   Remaining deficits: See goal section.   Education / Equipment: HEP.   Patient agrees to discharge. Patient goals were partially met. Patient is being discharged due to  completing course of PT.      [place] Applegate MPT

## 2023-07-15 DIAGNOSIS — H9190 Unspecified hearing loss, unspecified ear: Secondary | ICD-10-CM | POA: Diagnosis not present

## 2023-07-15 DIAGNOSIS — K219 Gastro-esophageal reflux disease without esophagitis: Secondary | ICD-10-CM | POA: Diagnosis not present

## 2023-07-15 DIAGNOSIS — L729 Follicular cyst of the skin and subcutaneous tissue, unspecified: Secondary | ICD-10-CM | POA: Diagnosis not present

## 2023-07-15 DIAGNOSIS — E7849 Other hyperlipidemia: Secondary | ICD-10-CM | POA: Diagnosis not present

## 2023-07-15 DIAGNOSIS — I2510 Atherosclerotic heart disease of native coronary artery without angina pectoris: Secondary | ICD-10-CM | POA: Diagnosis not present

## 2023-07-15 DIAGNOSIS — I10 Essential (primary) hypertension: Secondary | ICD-10-CM | POA: Diagnosis not present

## 2023-07-15 DIAGNOSIS — Z0001 Encounter for general adult medical examination with abnormal findings: Secondary | ICD-10-CM | POA: Diagnosis not present

## 2023-07-15 DIAGNOSIS — E1169 Type 2 diabetes mellitus with other specified complication: Secondary | ICD-10-CM | POA: Diagnosis not present

## 2023-07-15 DIAGNOSIS — E1122 Type 2 diabetes mellitus with diabetic chronic kidney disease: Secondary | ICD-10-CM | POA: Diagnosis not present

## 2023-09-19 DIAGNOSIS — H524 Presbyopia: Secondary | ICD-10-CM | POA: Diagnosis not present

## 2023-10-16 DIAGNOSIS — E1122 Type 2 diabetes mellitus with diabetic chronic kidney disease: Secondary | ICD-10-CM | POA: Diagnosis not present

## 2024-01-07 DIAGNOSIS — E1122 Type 2 diabetes mellitus with diabetic chronic kidney disease: Secondary | ICD-10-CM | POA: Diagnosis not present

## 2024-01-07 DIAGNOSIS — I10 Essential (primary) hypertension: Secondary | ICD-10-CM | POA: Diagnosis not present

## 2024-01-07 DIAGNOSIS — E1169 Type 2 diabetes mellitus with other specified complication: Secondary | ICD-10-CM | POA: Diagnosis not present

## 2024-01-07 DIAGNOSIS — K219 Gastro-esophageal reflux disease without esophagitis: Secondary | ICD-10-CM | POA: Diagnosis not present

## 2024-01-07 DIAGNOSIS — I2510 Atherosclerotic heart disease of native coronary artery without angina pectoris: Secondary | ICD-10-CM | POA: Diagnosis not present

## 2024-01-07 DIAGNOSIS — Z1329 Encounter for screening for other suspected endocrine disorder: Secondary | ICD-10-CM | POA: Diagnosis not present

## 2024-01-07 DIAGNOSIS — E7849 Other hyperlipidemia: Secondary | ICD-10-CM | POA: Diagnosis not present

## 2024-01-14 DIAGNOSIS — R030 Elevated blood-pressure reading, without diagnosis of hypertension: Secondary | ICD-10-CM | POA: Diagnosis not present

## 2024-01-14 DIAGNOSIS — E1169 Type 2 diabetes mellitus with other specified complication: Secondary | ICD-10-CM | POA: Diagnosis not present

## 2024-01-14 DIAGNOSIS — E782 Mixed hyperlipidemia: Secondary | ICD-10-CM | POA: Diagnosis not present

## 2024-01-14 DIAGNOSIS — Z0001 Encounter for general adult medical examination with abnormal findings: Secondary | ICD-10-CM | POA: Diagnosis not present

## 2024-01-14 DIAGNOSIS — Z6832 Body mass index (BMI) 32.0-32.9, adult: Secondary | ICD-10-CM | POA: Diagnosis not present

## 2024-01-14 DIAGNOSIS — I2510 Atherosclerotic heart disease of native coronary artery without angina pectoris: Secondary | ICD-10-CM | POA: Diagnosis not present

## 2024-01-14 DIAGNOSIS — K219 Gastro-esophageal reflux disease without esophagitis: Secondary | ICD-10-CM | POA: Diagnosis not present

## 2024-03-23 DIAGNOSIS — E119 Type 2 diabetes mellitus without complications: Secondary | ICD-10-CM | POA: Diagnosis not present

## 2024-07-15 DIAGNOSIS — E782 Mixed hyperlipidemia: Secondary | ICD-10-CM | POA: Diagnosis not present

## 2024-07-15 DIAGNOSIS — E1169 Type 2 diabetes mellitus with other specified complication: Secondary | ICD-10-CM | POA: Diagnosis not present

## 2024-07-15 DIAGNOSIS — E559 Vitamin D deficiency, unspecified: Secondary | ICD-10-CM | POA: Diagnosis not present

## 2024-07-15 DIAGNOSIS — Z1329 Encounter for screening for other suspected endocrine disorder: Secondary | ICD-10-CM | POA: Diagnosis not present

## 2024-07-15 DIAGNOSIS — Z0001 Encounter for general adult medical examination with abnormal findings: Secondary | ICD-10-CM | POA: Diagnosis not present

## 2024-07-15 DIAGNOSIS — E7849 Other hyperlipidemia: Secondary | ICD-10-CM | POA: Diagnosis not present

## 2024-07-15 DIAGNOSIS — D559 Anemia due to enzyme disorder, unspecified: Secondary | ICD-10-CM | POA: Diagnosis not present

## 2024-07-15 DIAGNOSIS — E785 Hyperlipidemia, unspecified: Secondary | ICD-10-CM | POA: Diagnosis not present

## 2024-07-22 DIAGNOSIS — Z0001 Encounter for general adult medical examination with abnormal findings: Secondary | ICD-10-CM | POA: Diagnosis not present

## 2024-07-22 DIAGNOSIS — I2510 Atherosclerotic heart disease of native coronary artery without angina pectoris: Secondary | ICD-10-CM | POA: Diagnosis not present

## 2024-07-22 DIAGNOSIS — E782 Mixed hyperlipidemia: Secondary | ICD-10-CM | POA: Diagnosis not present

## 2024-07-22 DIAGNOSIS — Z6832 Body mass index (BMI) 32.0-32.9, adult: Secondary | ICD-10-CM | POA: Diagnosis not present

## 2024-07-22 DIAGNOSIS — E1169 Type 2 diabetes mellitus with other specified complication: Secondary | ICD-10-CM | POA: Diagnosis not present

## 2024-07-22 DIAGNOSIS — K219 Gastro-esophageal reflux disease without esophagitis: Secondary | ICD-10-CM | POA: Diagnosis not present

## 2024-10-15 DIAGNOSIS — E876 Hypokalemia: Secondary | ICD-10-CM | POA: Diagnosis not present

## 2024-10-15 DIAGNOSIS — E7849 Other hyperlipidemia: Secondary | ICD-10-CM | POA: Diagnosis not present

## 2024-10-15 DIAGNOSIS — D559 Anemia due to enzyme disorder, unspecified: Secondary | ICD-10-CM | POA: Diagnosis not present

## 2024-10-15 DIAGNOSIS — I10 Essential (primary) hypertension: Secondary | ICD-10-CM | POA: Diagnosis not present

## 2024-10-15 DIAGNOSIS — E1122 Type 2 diabetes mellitus with diabetic chronic kidney disease: Secondary | ICD-10-CM | POA: Diagnosis not present

## 2024-10-15 DIAGNOSIS — Z0001 Encounter for general adult medical examination with abnormal findings: Secondary | ICD-10-CM | POA: Diagnosis not present

## 2024-10-26 DIAGNOSIS — I2510 Atherosclerotic heart disease of native coronary artery without angina pectoris: Secondary | ICD-10-CM | POA: Diagnosis not present

## 2024-10-26 DIAGNOSIS — R030 Elevated blood-pressure reading, without diagnosis of hypertension: Secondary | ICD-10-CM | POA: Diagnosis not present

## 2024-10-26 DIAGNOSIS — K219 Gastro-esophageal reflux disease without esophagitis: Secondary | ICD-10-CM | POA: Diagnosis not present

## 2024-10-26 DIAGNOSIS — M25532 Pain in left wrist: Secondary | ICD-10-CM | POA: Diagnosis not present

## 2024-10-26 DIAGNOSIS — Z23 Encounter for immunization: Secondary | ICD-10-CM | POA: Diagnosis not present

## 2024-10-26 DIAGNOSIS — N400 Enlarged prostate without lower urinary tract symptoms: Secondary | ICD-10-CM | POA: Diagnosis not present

## 2024-10-26 DIAGNOSIS — M25569 Pain in unspecified knee: Secondary | ICD-10-CM | POA: Diagnosis not present

## 2024-10-26 DIAGNOSIS — E876 Hypokalemia: Secondary | ICD-10-CM | POA: Diagnosis not present

## 2024-10-26 DIAGNOSIS — E1169 Type 2 diabetes mellitus with other specified complication: Secondary | ICD-10-CM | POA: Diagnosis not present
